# Patient Record
Sex: Female | Born: 1955 | Race: Black or African American | Hispanic: No | Marital: Single | Smoking: Current every day smoker
Health system: Southern US, Community
[De-identification: ages and names within clinical notes are randomized; demographics above are authoritative.]

## PROBLEM LIST (undated history)

## (undated) DIAGNOSIS — I10 Essential (primary) hypertension: Secondary | ICD-10-CM

## (undated) DIAGNOSIS — Z720 Tobacco use: Secondary | ICD-10-CM

## (undated) DIAGNOSIS — I639 Cerebral infarction, unspecified: Secondary | ICD-10-CM

## (undated) DIAGNOSIS — K219 Gastro-esophageal reflux disease without esophagitis: Secondary | ICD-10-CM

## (undated) DIAGNOSIS — S2232XA Fracture of one rib, left side, initial encounter for closed fracture: Secondary | ICD-10-CM

## (undated) DIAGNOSIS — E785 Hyperlipidemia, unspecified: Secondary | ICD-10-CM

## (undated) HISTORY — PX: LUNG SURGERY: SHX703

## (undated) HISTORY — DX: Fracture of one rib, left side, initial encounter for closed fracture: S22.32XA

## (undated) HISTORY — DX: Tobacco use: Z72.0

## (undated) HISTORY — DX: Cerebral infarction, unspecified: I63.9

## (undated) HISTORY — DX: Gastro-esophageal reflux disease without esophagitis: K21.9

## (undated) HISTORY — DX: Hyperlipidemia, unspecified: E78.5

---

## 2001-07-29 ENCOUNTER — Emergency Department (HOSPITAL_COMMUNITY): Admission: EM | Admit: 2001-07-29 | Discharge: 2001-07-29 | Payer: Self-pay | Admitting: Emergency Medicine

## 2001-07-29 ENCOUNTER — Encounter: Payer: Self-pay | Admitting: Emergency Medicine

## 2004-03-13 ENCOUNTER — Emergency Department (HOSPITAL_COMMUNITY): Admission: EM | Admit: 2004-03-13 | Discharge: 2004-03-13 | Payer: Self-pay | Admitting: Emergency Medicine

## 2004-03-13 ENCOUNTER — Emergency Department (HOSPITAL_COMMUNITY): Admission: EM | Admit: 2004-03-13 | Discharge: 2004-03-14 | Payer: Self-pay | Admitting: Emergency Medicine

## 2004-03-17 ENCOUNTER — Emergency Department (HOSPITAL_COMMUNITY): Admission: EM | Admit: 2004-03-17 | Discharge: 2004-03-17 | Payer: Self-pay | Admitting: Family Medicine

## 2004-09-11 ENCOUNTER — Emergency Department (HOSPITAL_COMMUNITY): Admission: EM | Admit: 2004-09-11 | Discharge: 2004-09-11 | Payer: Self-pay | Admitting: Family Medicine

## 2006-04-10 ENCOUNTER — Emergency Department (HOSPITAL_COMMUNITY): Admission: EM | Admit: 2006-04-10 | Discharge: 2006-04-10 | Payer: Self-pay | Admitting: Emergency Medicine

## 2006-04-11 ENCOUNTER — Emergency Department (HOSPITAL_COMMUNITY): Admission: EM | Admit: 2006-04-11 | Discharge: 2006-04-11 | Payer: Self-pay | Admitting: *Deleted

## 2006-04-16 ENCOUNTER — Emergency Department (HOSPITAL_COMMUNITY): Admission: EM | Admit: 2006-04-16 | Discharge: 2006-04-16 | Payer: Self-pay | Admitting: Family Medicine

## 2006-04-19 ENCOUNTER — Ambulatory Visit (HOSPITAL_COMMUNITY): Admission: RE | Admit: 2006-04-19 | Discharge: 2006-04-19 | Payer: Self-pay | Admitting: Emergency Medicine

## 2006-04-19 ENCOUNTER — Emergency Department (HOSPITAL_COMMUNITY): Admission: EM | Admit: 2006-04-19 | Discharge: 2006-04-20 | Payer: Self-pay | Admitting: Emergency Medicine

## 2006-04-21 ENCOUNTER — Observation Stay (HOSPITAL_COMMUNITY): Admission: EM | Admit: 2006-04-21 | Discharge: 2006-04-21 | Payer: Self-pay | Admitting: Emergency Medicine

## 2006-04-21 ENCOUNTER — Ambulatory Visit: Payer: Self-pay | Admitting: Cardiology

## 2006-05-26 ENCOUNTER — Ambulatory Visit: Payer: Self-pay | Admitting: Family Medicine

## 2006-09-21 ENCOUNTER — Ambulatory Visit: Payer: Self-pay | Admitting: Family Medicine

## 2006-11-19 ENCOUNTER — Emergency Department (HOSPITAL_COMMUNITY): Admission: EM | Admit: 2006-11-19 | Discharge: 2006-11-19 | Payer: Self-pay | Admitting: Emergency Medicine

## 2006-11-21 ENCOUNTER — Emergency Department (HOSPITAL_COMMUNITY): Admission: EM | Admit: 2006-11-21 | Discharge: 2006-11-21 | Payer: Self-pay | Admitting: Emergency Medicine

## 2006-11-25 ENCOUNTER — Ambulatory Visit: Payer: Self-pay | Admitting: Family Medicine

## 2007-04-12 ENCOUNTER — Ambulatory Visit: Payer: Self-pay | Admitting: Family Medicine

## 2007-04-23 ENCOUNTER — Emergency Department (HOSPITAL_COMMUNITY): Admission: EM | Admit: 2007-04-23 | Discharge: 2007-04-23 | Payer: Self-pay | Admitting: Emergency Medicine

## 2007-04-26 ENCOUNTER — Ambulatory Visit: Payer: Self-pay | Admitting: *Deleted

## 2007-08-24 ENCOUNTER — Encounter (INDEPENDENT_AMBULATORY_CARE_PROVIDER_SITE_OTHER): Payer: Self-pay | Admitting: *Deleted

## 2007-08-31 ENCOUNTER — Ambulatory Visit: Payer: Self-pay | Admitting: Internal Medicine

## 2008-01-28 ENCOUNTER — Inpatient Hospital Stay (HOSPITAL_COMMUNITY): Admission: EM | Admit: 2008-01-28 | Discharge: 2008-02-05 | Payer: Self-pay | Admitting: Emergency Medicine

## 2008-02-15 ENCOUNTER — Ambulatory Visit: Payer: Self-pay | Admitting: Family Medicine

## 2008-03-26 ENCOUNTER — Emergency Department (HOSPITAL_COMMUNITY): Admission: EM | Admit: 2008-03-26 | Discharge: 2008-03-26 | Payer: Self-pay | Admitting: Emergency Medicine

## 2008-03-27 ENCOUNTER — Emergency Department (HOSPITAL_COMMUNITY): Admission: EM | Admit: 2008-03-27 | Discharge: 2008-03-28 | Payer: Self-pay | Admitting: Emergency Medicine

## 2008-04-09 ENCOUNTER — Ambulatory Visit: Payer: Self-pay | Admitting: Internal Medicine

## 2008-05-28 ENCOUNTER — Emergency Department (HOSPITAL_COMMUNITY): Admission: EM | Admit: 2008-05-28 | Discharge: 2008-05-29 | Payer: Self-pay | Admitting: Emergency Medicine

## 2008-05-30 ENCOUNTER — Emergency Department (HOSPITAL_COMMUNITY): Admission: EM | Admit: 2008-05-30 | Discharge: 2008-05-30 | Payer: Self-pay | Admitting: Emergency Medicine

## 2008-06-07 ENCOUNTER — Ambulatory Visit: Payer: Self-pay | Admitting: Internal Medicine

## 2008-06-14 ENCOUNTER — Ambulatory Visit: Payer: Self-pay | Admitting: Internal Medicine

## 2008-06-15 ENCOUNTER — Ambulatory Visit: Payer: Self-pay | Admitting: Internal Medicine

## 2008-06-18 ENCOUNTER — Ambulatory Visit: Payer: Self-pay | Admitting: Internal Medicine

## 2009-06-30 ENCOUNTER — Emergency Department (HOSPITAL_COMMUNITY): Admission: EM | Admit: 2009-06-30 | Discharge: 2009-06-30 | Payer: Self-pay | Admitting: Emergency Medicine

## 2009-07-02 ENCOUNTER — Emergency Department (HOSPITAL_COMMUNITY): Admission: EM | Admit: 2009-07-02 | Discharge: 2009-07-03 | Payer: Self-pay | Admitting: Emergency Medicine

## 2009-07-03 ENCOUNTER — Ambulatory Visit: Payer: Self-pay | Admitting: Internal Medicine

## 2009-07-24 ENCOUNTER — Ambulatory Visit: Payer: Self-pay | Admitting: Internal Medicine

## 2009-07-29 ENCOUNTER — Ambulatory Visit (HOSPITAL_COMMUNITY): Admission: RE | Admit: 2009-07-29 | Discharge: 2009-07-29 | Payer: Self-pay | Admitting: Internal Medicine

## 2009-08-27 ENCOUNTER — Ambulatory Visit: Payer: Self-pay | Admitting: Internal Medicine

## 2009-08-27 LAB — CONVERTED CEMR LAB
Alkaline Phosphatase: 63 units/L (ref 39–117)
BUN: 15 mg/dL (ref 6–23)
Basophils Absolute: 0 10*3/uL (ref 0.0–0.1)
Basophils Relative: 0 % (ref 0–1)
CO2: 25 meq/L (ref 19–32)
Cholesterol: 249 mg/dL — ABNORMAL HIGH (ref 0–200)
Creatinine, Ser: 0.73 mg/dL (ref 0.40–1.20)
Eosinophils Absolute: 0 10*3/uL (ref 0.0–0.7)
Eosinophils Relative: 1 % (ref 0–5)
Glucose, Bld: 97 mg/dL (ref 70–99)
HCT: 41.8 % (ref 36.0–46.0)
HDL: 49 mg/dL (ref >39)
Hemoglobin: 14 g/dL (ref 12.0–15.0)
LDL Cholesterol: 174 mg/dL — ABNORMAL HIGH (ref 0–99)
MCHC: 33.5 g/dL (ref 30.0–36.0)
MCV: 93.5 fL (ref 78.0–100.0)
Monocytes Absolute: 0.2 10*3/uL (ref 0.1–1.0)
Monocytes Relative: 5 % (ref 3–12)
Neutro Abs: 1.9 10*3/uL (ref 1.7–7.7)
RBC: 4.47 M/uL (ref 3.87–5.11)
RDW: 14.4 % (ref 11.5–15.5)
Total Bilirubin: 0.5 mg/dL (ref 0.3–1.2)
Total Protein: 7.1 g/dL (ref 6.0–8.3)
Triglycerides: 132 mg/dL (ref <150)
VLDL: 26 mg/dL (ref 0–40)

## 2009-10-02 ENCOUNTER — Encounter: Payer: Self-pay | Admitting: Internal Medicine

## 2009-10-02 ENCOUNTER — Ambulatory Visit: Payer: Self-pay | Admitting: Internal Medicine

## 2009-10-02 LAB — CONVERTED CEMR LAB: GC Probe Amp, Genital: NEGATIVE

## 2010-04-07 ENCOUNTER — Emergency Department (HOSPITAL_COMMUNITY): Admission: EM | Admit: 2010-04-07 | Discharge: 2010-04-07 | Payer: Self-pay | Admitting: Emergency Medicine

## 2010-04-09 ENCOUNTER — Emergency Department (HOSPITAL_COMMUNITY): Admission: EM | Admit: 2010-04-09 | Discharge: 2010-04-09 | Payer: Self-pay | Admitting: Emergency Medicine

## 2010-04-16 ENCOUNTER — Ambulatory Visit: Payer: Self-pay | Admitting: Internal Medicine

## 2010-04-16 LAB — CONVERTED CEMR LAB
Albumin: 4.3 g/dL (ref 3.5–5.2)
BUN: 33 mg/dL — ABNORMAL HIGH (ref 6–23)
Calcium: 9.7 mg/dL (ref 8.4–10.5)
Chloride: 98 meq/L (ref 96–112)
Creatinine, Ser: 1.11 mg/dL (ref 0.40–1.20)
Eosinophils Absolute: 0.1 10*3/uL (ref 0.0–0.7)
Eosinophils Relative: 1 % (ref 0–5)
Glucose, Bld: 91 mg/dL (ref 70–99)
HCT: 41.5 % (ref 36.0–46.0)
Hemoglobin: 13.9 g/dL (ref 12.0–15.0)
Lipase: 56 units/L (ref 0–75)
Lymphocytes Relative: 46 % (ref 12–46)
Lymphs Abs: 2.8 10*3/uL (ref 0.7–4.0)
MCV: 95.6 fL (ref 78.0–100.0)
Monocytes Absolute: 0.4 10*3/uL (ref 0.1–1.0)
Platelets: 264 10*3/uL (ref 150–400)
Potassium: 3 meq/L — ABNORMAL LOW (ref 3.5–5.3)
RDW: 14.1 % (ref 11.5–15.5)

## 2010-12-28 ENCOUNTER — Encounter: Payer: Self-pay | Admitting: *Deleted

## 2011-02-24 LAB — URINE MICROSCOPIC-ADD ON

## 2011-02-24 LAB — WET PREP, GENITAL: Yeast Wet Prep HPF POC: NONE SEEN

## 2011-02-24 LAB — URINE CULTURE: Colony Count: NO GROWTH

## 2011-02-24 LAB — URINALYSIS, ROUTINE W REFLEX MICROSCOPIC
Glucose, UA: NEGATIVE mg/dL
Leukocytes, UA: NEGATIVE
Nitrite: NEGATIVE
Protein, ur: >300 mg/dL — AB
Specific Gravity, Urine: 1.031 — ABNORMAL HIGH (ref 1.005–1.030)
Specific Gravity, Urine: 1.046 — ABNORMAL HIGH (ref 1.005–1.030)
Urobilinogen, UA: 0.2 mg/dL (ref 0.0–1.0)
Urobilinogen, UA: 2 mg/dL — ABNORMAL HIGH (ref 0.0–1.0)
pH: 5 (ref 5.0–8.0)

## 2011-02-24 LAB — COMPREHENSIVE METABOLIC PANEL
CO2: 25 mEq/L (ref 19–32)
Calcium: 9.4 mg/dL (ref 8.4–10.5)
Creatinine, Ser: 0.73 mg/dL (ref 0.4–1.2)
GFR calc Af Amer: >60 mL/min (ref >60)
GFR calc non Af Amer: >60 mL/min (ref >60)
Glucose, Bld: 99 mg/dL (ref 70–99)

## 2011-02-24 LAB — DIFFERENTIAL
Basophils Absolute: 0 10*3/uL (ref 0.0–0.1)
Basophils Relative: 0 % (ref 0–1)
Eosinophils Absolute: 0 10*3/uL (ref 0.0–0.7)
Eosinophils Relative: 0 % (ref 0–5)
Monocytes Absolute: 0.6 10*3/uL (ref 0.1–1.0)
Monocytes Relative: 7 % (ref 3–12)

## 2011-02-24 LAB — CBC
Hemoglobin: 17.9 g/dL — ABNORMAL HIGH (ref 12.0–15.0)
MCHC: 35.1 g/dL (ref 30.0–36.0)
MCV: 96.3 fL (ref 78.0–100.0)
RBC: 5.3 MIL/uL — ABNORMAL HIGH (ref 3.87–5.11)

## 2011-02-24 LAB — GC/CHLAMYDIA PROBE AMP, GENITAL
Chlamydia, DNA Probe: NEGATIVE
GC Probe Amp, Genital: NEGATIVE

## 2011-03-15 LAB — URINE CULTURE: Colony Count: 10000

## 2011-03-15 LAB — RAPID URINE DRUG SCREEN, HOSP PERFORMED: Barbiturates: NOT DETECTED

## 2011-03-15 LAB — URINE MICROSCOPIC-ADD ON

## 2011-03-15 LAB — CBC
MCHC: 34 g/dL (ref 30.0–36.0)
MCHC: 34.2 g/dL (ref 30.0–36.0)
MCV: 95.7 fL (ref 78.0–100.0)
MCV: 95.9 fL (ref 78.0–100.0)
RBC: 5.32 MIL/uL — ABNORMAL HIGH (ref 3.87–5.11)
RDW: 13.6 % (ref 11.5–15.5)
RDW: 14.1 % (ref 11.5–15.5)

## 2011-03-15 LAB — DIFFERENTIAL
Eosinophils Absolute: 0 10*3/uL (ref 0.0–0.7)
Eosinophils Relative: 0 % (ref 0–5)
Lymphocytes Relative: 19 % (ref 12–46)
Lymphocytes Relative: 28 % (ref 12–46)
Lymphs Abs: 1.6 10*3/uL (ref 0.7–4.0)
Lymphs Abs: 2.1 10*3/uL (ref 0.7–4.0)
Monocytes Relative: 6 % (ref 3–12)
Monocytes Relative: 8 % (ref 3–12)
Neutrophils Relative %: 64 % (ref 43–77)
Neutrophils Relative %: 76 % (ref 43–77)

## 2011-03-15 LAB — URINALYSIS, ROUTINE W REFLEX MICROSCOPIC
Glucose, UA: NEGATIVE mg/dL
Ketones, ur: 15 mg/dL — AB
Nitrite: NEGATIVE
Nitrite: NEGATIVE
Specific Gravity, Urine: 1.046 — ABNORMAL HIGH (ref 1.005–1.030)
pH: 6 (ref 5.0–8.0)
pH: 6 (ref 5.0–8.0)

## 2011-03-15 LAB — COMPREHENSIVE METABOLIC PANEL
ALT: 11 U/L (ref 0–35)
ALT: 9 U/L (ref 0–35)
AST: 12 U/L (ref 0–37)
AST: 19 U/L (ref 0–37)
CO2: 25 mEq/L (ref 19–32)
Calcium: 10.1 mg/dL (ref 8.4–10.5)
Calcium: 9.3 mg/dL (ref 8.4–10.5)
Creatinine, Ser: 0.72 mg/dL (ref 0.4–1.2)
Creatinine, Ser: 0.81 mg/dL (ref 0.4–1.2)
GFR calc Af Amer: >60 mL/min (ref >60)
GFR calc Af Amer: >60 mL/min (ref >60)
GFR calc non Af Amer: >60 mL/min (ref >60)
GFR calc non Af Amer: >60 mL/min (ref >60)
Glucose, Bld: 90 mg/dL (ref 70–99)
Sodium: 133 mEq/L — ABNORMAL LOW (ref 135–145)
Sodium: 138 mEq/L (ref 135–145)
Total Protein: 7.2 g/dL (ref 6.0–8.3)
Total Protein: 8.2 g/dL (ref 6.0–8.3)

## 2011-03-15 LAB — PREGNANCY, URINE: Preg Test, Ur: NEGATIVE

## 2011-03-15 LAB — LIPASE, BLOOD: Lipase: 22 U/L (ref 11–59)
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
## 2011-04-24 NOTE — H&P (Signed)
NAMEPORTLAND, SARINANA             ACCOUNT NO.:  [id number]   MEDICAL RECORD NO.:  [id number]          PATIENT TYPE:  EMS   LOCATION:  MAJO                         FACILITY:  [facility]   PHYSICIAN:  Hollice Espy, M.D.DATE OF BIRTH:  [birth year]   DATE OF PROCEDURE:  DATE OF DISCHARGE:                      STAT - MUST CHANGE TO CORRECT WORK TYPE   ATTENDING PHYSICIAN:  Corinna L. Lendell Caprice, M.D.   PRIMARY CARE PHYSICIAN:  The patient has no primary care physician.   CHIEF COMPLAINT:  Stroke.   HISTORY OF PRESENT ILLNESS:  The patient is a 55 year old African American  female with past medical history only for a recent UTI who had several days  prior to admission had episodes of blurry vision and has loss of vision. She  became concerned and came to the emergency room for further evaluation.  A  CT scan of her head which was done was negative.  The patient was set up and  had a outpatient MRI done which showed acute small infarct in the left  periventricular area.  Given these findings, she was called and told to come  back to the emergency room for further evaluation.  This was on Apr 19, 2006.  The patient was evaluated by neurology who felt the patient should be  admitted by the medical service.  The patient, however, said that she had to  tend for a sick daughter and she left the emergency room against medical  advice.  She returned back today saying that her daughter was feeling  better.  Currently the patient is doing well.  Her initial blurry and loss  of vision symptoms had resolved initially prior to her MRI.  Currently she  has been well. She denies any headaches, vision changes, dysphagia, chest  pain, palpitations, shortness of breath, wheezing, cough, abdominal pain,  hematuria, dysuria, constipation, diarrhea, focal extremity numbness,  weakness, or pain. Review of systems otherwise negative.   PAST MEDICAL HISTORY:  Recent UTI and ovarian cysts. She tells me that  after  being evaluated now, her blood pressure has been high but she has never been  told that she has high blood pressure   MEDICATIONS:  1.  Flagyl 500 mg p.o. t.i.d. for two days.  2.  Cipro 500 mg p.o. b.i.d. for one more day.  3.  Multivitamins p.o. daily.  4.  P.r.n. Aleve.   SOCIAL HISTORY:  The patient smokes half pack a day of tobacco.  She denies  any alcohol or drug use.   FAMILY HISTORY:  Noncontributory.   ALLERGIES:  No known drug allergies.   PHYSICAL EXAMINATION:  VITAL SIGNS:  Temperature 98, heart rate 90, blood  pressure initially 144/101, since then has dropped to 121/96, respirations  25, oxygen saturation 97% on room air.  GENERAL:  The patient is alert and oriented x3, in no apparent distress.  HEENT:  Normocephalic, atraumatic.  Cranial nerves II-XII intact. No carotid  bruits.  HEART:  Regular rate and rhythm. Occasional ectopic beat.  LUNGS:  Clear to auscultation bilaterally.  ABDOMEN:  Soft, nontender.  Positive bowel sounds.  EXTREMITIES:  No clubbing  or cyanosis or edema.  MUSCULOSKELETAL:  Completely intact and equal on all sides.  Flexion and  extension and grip on upper and lower extremities.  No pronator drift.  No  cerebellar dysfunction.   LABORATORY DATA:  Based on the patient's previous labs, BMET, CBC normal.  Repeat within normal limits.   ASSESSMENT/PLAN:  1.  Acute CVA, likely secondary to undiagnosed and uncontrolled      hypertension.  Plan for admitting the patient, put her on aspirin 325 mg      p.o. daily.  Start her on low dose lisinopril.  I do not want to      hypoperfuse her too quickly as well as check __________ bilateral      carotid Dopplers as well as notifying neurology of her readmission.  2.  Tobacco abuse.  Provide tobacco counseling.  3.  Recent UTI and ovarian cyst.  Continue Cipro and Flagyl.      Hollice Espy, M.D.  Electronically Signed     SKK/MEDQ  D:  04/21/2006  T:  04/21/2006  Job:  [id number]

## 2011-04-24 NOTE — Discharge Summary (Signed)
NAME:  Brittany Burns, Brittany Burns             ACCOUNT NO.:  [id number]   MEDICAL RECORD NO.:  [id number]           PATIENT TYPE:   LOCATION:                                 FACILITY:   PHYSICIAN:  Corinna L. Lendell Caprice, MD     DATE OF BIRTH:   DATE OF ADMISSION:  04/21/2006  DATE OF DISCHARGE:                                 DISCHARGE SUMMARY   DISCHARGE DIAGNOSES:  1.  Subacute stroke, without any residual deficits.  2.  Hypertension.   DISCHARGE MEDICATIONS:  1.  Aspirin 325 mg a day.  2.  Hydrochlorothiazide 25 mg a day.   CONDITION:  Stable.   ACTIVITY:  Ad lib.   FOLLOWUP:  With [facility] first available appointment.   DIET:  Should be low-salt.   HISTORY AND HOSPITAL COURSE:  [name] is a 55 year old black female  patient who was admitted with a subacute stroke.  Apparently she had  presented to the emergency room several days prior to this admission with  complaints of blurry vision as well as apparently acute loss of vision.  These episodes resolved.  She had a CT scan of the brain which was negative;  and it was recommended that she be admitted to the hospital, but she left  against medical advice.  Somebody schedule an outpatient MRI scan which  showed an infarct of the left periventricular area; I believe this was ED  staff, there were mild atherosclerotic-type changes, but the MRA was  limited.  She had carotid Dopplers which showed no critical stenosis.  She  has an echocardiogram--results of which are pending.  The patient is very  anxious to go.  She was started on aspirin and will be discharged on  hydrochlorothiazide __________ due to fear of needles.  Please see H&P for  complete __________ were significant for a urine drug screen positive for  THC and benzodiazepines.  The patient voices understanding.  She also has a  history of tobacco abuse and was counseled against.      Corinna L. Lendell Caprice, MD  Electronically Signed     CLS/MEDQ  D:   04/21/2006  T:  04/22/2006  Job:  [id number]

## 2011-08-28 LAB — BASIC METABOLIC PANEL
CO2: 26
Chloride: 102
Creatinine, Ser: 0.7
GFR calc Af Amer: >60
Glucose, Bld: 100 — ABNORMAL HIGH
Sodium: 136

## 2011-08-28 LAB — DIFFERENTIAL
Basophils Absolute: 0
Eosinophils Relative: 0
Lymphocytes Relative: 9 — ABNORMAL LOW
Lymphs Abs: 1.3
Neutro Abs: 13.6 — ABNORMAL HIGH
Neutrophils Relative %: 90 — ABNORMAL HIGH

## 2011-08-28 LAB — URINE MICROSCOPIC-ADD ON

## 2011-08-28 LAB — SAMPLE TO BLOOD BANK

## 2011-08-28 LAB — CBC
Hemoglobin: 13
MCHC: 34.3
MCV: 95
Platelets: 271
RBC: 4
RDW: 14.4
RDW: 14.4
WBC: 15.1 — ABNORMAL HIGH

## 2011-08-28 LAB — POCT I-STAT CREATININE: Creatinine, Ser: 0.8

## 2011-08-28 LAB — URINALYSIS, ROUTINE W REFLEX MICROSCOPIC
Glucose, UA: NEGATIVE
Leukocytes, UA: NEGATIVE
Protein, ur: 30 — AB
pH: 5.5

## 2011-08-28 LAB — I-STAT 8, (EC8 V) (CONVERTED LAB)
Acid-base deficit: 4 — ABNORMAL HIGH
Bicarbonate: 21.7
Chloride: 106
Operator id: 294521
pCO2, Ven: 40.1 — ABNORMAL LOW
pH, Ven: 7.342 — ABNORMAL HIGH

## 2011-08-28 LAB — POCT PREGNANCY, URINE
Operator id: 29452
Preg Test, Ur: NEGATIVE

## 2011-08-29 ENCOUNTER — Emergency Department (HOSPITAL_COMMUNITY)
Admission: EM | Admit: 2011-08-29 | Discharge: 2011-08-29 | Disposition: A | Discharge status: Home / Self Care | Payer: Self-pay | Attending: Emergency Medicine | Admitting: Emergency Medicine

## 2011-08-29 DIAGNOSIS — F17200 Nicotine dependence, unspecified, uncomplicated: Secondary | ICD-10-CM | POA: Insufficient documentation

## 2011-08-29 DIAGNOSIS — Z79899 Other long term (current) drug therapy: Secondary | ICD-10-CM | POA: Insufficient documentation

## 2011-08-29 DIAGNOSIS — R040 Epistaxis: Secondary | ICD-10-CM | POA: Insufficient documentation

## 2011-08-29 DIAGNOSIS — Z9119 Patient's noncompliance with other medical treatment and regimen: Secondary | ICD-10-CM | POA: Insufficient documentation

## 2011-08-29 DIAGNOSIS — I10 Essential (primary) hypertension: Secondary | ICD-10-CM | POA: Insufficient documentation

## 2011-08-29 DIAGNOSIS — Z91199 Patient's noncompliance with other medical treatment and regimen due to unspecified reason: Secondary | ICD-10-CM | POA: Insufficient documentation

## 2011-08-29 DIAGNOSIS — F1210 Cannabis abuse, uncomplicated: Secondary | ICD-10-CM | POA: Insufficient documentation

## 2011-08-29 LAB — CBC
MCH: 31.9 pg (ref 26.0–34.0)
MCV: 92.2 fL (ref 78.0–100.0)
Platelets: 230 10*3/uL (ref 150–400)
RDW: 14 % (ref 11.5–15.5)

## 2011-09-01 LAB — CBC
HCT: 44.6
Hemoglobin: 15.7 — ABNORMAL HIGH
Hemoglobin: 16 — ABNORMAL HIGH
MCHC: 34.2
Platelets: 305
Platelets: 288
RBC: 4.68
RDW: 13.6
WBC: 9.1

## 2011-09-01 LAB — POCT PREGNANCY, URINE: Operator id: 29728

## 2011-09-01 LAB — COMPREHENSIVE METABOLIC PANEL
ALT: 12
AST: 20
Albumin: 4.6
Alkaline Phosphatase: 78
BUN: 14
Calcium: 9.2
Creatinine, Ser: 0.72
GFR calc Af Amer: >60
GFR calc Af Amer: >60
Glucose, Bld: 100 — ABNORMAL HIGH
Potassium: 3.3 — ABNORMAL LOW
Sodium: 131 — ABNORMAL LOW
Total Protein: 8.4 — ABNORMAL HIGH
Total Protein: 7.4

## 2011-09-01 LAB — URINE MICROSCOPIC-ADD ON

## 2011-09-01 LAB — URINALYSIS, ROUTINE W REFLEX MICROSCOPIC
Glucose, UA: NEGATIVE
Ketones, ur: >80 — AB
Nitrite: NEGATIVE
Protein, ur: >300 — AB
Specific Gravity, Urine: 1.041 — ABNORMAL HIGH
Urobilinogen, UA: 1

## 2011-09-01 LAB — DIFFERENTIAL
Eosinophils Absolute: 0
Eosinophils Relative: 0
Lymphocytes Relative: 15
Lymphs Abs: 2.1
Monocytes Absolute: 0.5
Monocytes Relative: 6
Monocytes Relative: 9
Neutro Abs: 7.2
Neutrophils Relative %: 63

## 2011-09-03 LAB — POCT I-STAT, CHEM 8
BUN: 16
Calcium, Ion: 1.13
Chloride: 102
Creatinine, Ser: 0.8
Glucose, Bld: 123 — ABNORMAL HIGH
TCO2: 25

## 2011-09-03 LAB — URINALYSIS, ROUTINE W REFLEX MICROSCOPIC
Bilirubin Urine: NEGATIVE
Glucose, UA: NEGATIVE
Leukocytes, UA: NEGATIVE
Nitrite: NEGATIVE
Protein, ur: >300 — AB
Specific Gravity, Urine: 1.029
pH: 5.5
pH: 6

## 2011-09-03 LAB — URINE CULTURE

## 2011-09-03 LAB — DIFFERENTIAL
Basophils Absolute: 0.1
Basophils Relative: 0
Basophils Relative: 1
Eosinophils Absolute: 0
Eosinophils Absolute: 0
Eosinophils Relative: 0
Lymphs Abs: 1.2
Monocytes Absolute: 0.6
Monocytes Relative: 6
Neutro Abs: 9.9 — ABNORMAL HIGH
Neutrophils Relative %: 69

## 2011-09-03 LAB — URINE MICROSCOPIC-ADD ON

## 2011-09-03 LAB — CBC
HCT: 48.9 — ABNORMAL HIGH
MCHC: 33.9
MCV: 95.6
Platelets: 294
Platelets: 318
RDW: 13.8
RDW: 14
WBC: 9.5

## 2011-09-03 LAB — COMPREHENSIVE METABOLIC PANEL
ALT: 15
Albumin: 4
Alkaline Phosphatase: 73
GFR calc Af Amer: >60
Potassium: 3.3 — ABNORMAL LOW
Sodium: 138
Total Protein: 7.1

## 2011-09-03 LAB — LIPASE, BLOOD: Lipase: 24

## 2011-09-03 LAB — PREGNANCY, URINE: Preg Test, Ur: NEGATIVE

## 2012-01-17 ENCOUNTER — Emergency Department (HOSPITAL_COMMUNITY)
Admission: EM | Admit: 2012-01-17 | Discharge: 2012-01-17 | Disposition: A | Discharge status: Home / Self Care | Payer: Self-pay | Attending: Emergency Medicine | Admitting: Emergency Medicine

## 2012-01-17 ENCOUNTER — Encounter (HOSPITAL_COMMUNITY): Payer: Self-pay | Admitting: *Deleted

## 2012-01-17 DIAGNOSIS — Z79899 Other long term (current) drug therapy: Secondary | ICD-10-CM | POA: Insufficient documentation

## 2012-01-17 DIAGNOSIS — F17200 Nicotine dependence, unspecified, uncomplicated: Secondary | ICD-10-CM | POA: Insufficient documentation

## 2012-01-17 DIAGNOSIS — R141 Gas pain: Secondary | ICD-10-CM | POA: Insufficient documentation

## 2012-01-17 DIAGNOSIS — R112 Nausea with vomiting, unspecified: Secondary | ICD-10-CM | POA: Insufficient documentation

## 2012-01-17 DIAGNOSIS — K219 Gastro-esophageal reflux disease without esophagitis: Secondary | ICD-10-CM | POA: Insufficient documentation

## 2012-01-17 DIAGNOSIS — R142 Eructation: Secondary | ICD-10-CM | POA: Insufficient documentation

## 2012-01-17 DIAGNOSIS — R197 Diarrhea, unspecified: Secondary | ICD-10-CM | POA: Insufficient documentation

## 2012-01-17 DIAGNOSIS — I10 Essential (primary) hypertension: Secondary | ICD-10-CM | POA: Insufficient documentation

## 2012-01-17 HISTORY — DX: Essential (primary) hypertension: I10

## 2012-01-17 LAB — URINE MICROSCOPIC-ADD ON

## 2012-01-17 LAB — URINALYSIS, ROUTINE W REFLEX MICROSCOPIC
Ketones, ur: 15 mg/dL — AB
Leukocytes, UA: NEGATIVE
Nitrite: NEGATIVE
Protein, ur: >300 mg/dL — AB

## 2012-01-17 LAB — BASIC METABOLIC PANEL
CO2: 24 mEq/L (ref 19–32)
Chloride: 102 mEq/L (ref 96–112)
Glucose, Bld: 107 mg/dL — ABNORMAL HIGH (ref 70–99)
Potassium: 3.2 mEq/L — ABNORMAL LOW (ref 3.5–5.1)
Sodium: 138 mEq/L (ref 135–145)

## 2012-01-17 LAB — CBC
Hemoglobin: 15.7 g/dL — ABNORMAL HIGH (ref 12.0–15.0)
MCH: 31.5 pg (ref 26.0–34.0)
Platelets: 310 10*3/uL (ref 150–400)
RBC: 4.98 MIL/uL (ref 3.87–5.11)
WBC: 11.6 10*3/uL — ABNORMAL HIGH (ref 4.0–10.5)

## 2012-01-17 MED ORDER — GI COCKTAIL ~~LOC~~
30.0000 mL | Freq: Once | ORAL | Status: AC
  Administered 2012-01-17: 30 mL via ORAL
  Filled 2012-01-17: qty 30

## 2012-01-17 MED ORDER — SODIUM CHLORIDE 0.9 % IV BOLUS (SEPSIS)
1000.0000 mL | Freq: Once | INTRAVENOUS | Status: AC
  Administered 2012-01-17: 1000 mL via INTRAVENOUS

## 2012-01-17 MED ORDER — ONDANSETRON HCL 4 MG/2ML IJ SOLN
4.0000 mg | Freq: Once | INTRAMUSCULAR | Status: AC
  Administered 2012-01-17: 4 mg via INTRAVENOUS
  Filled 2012-01-17: qty 2

## 2012-01-17 MED ORDER — ACETAMINOPHEN 325 MG PO TABS
650.0000 mg | ORAL_TABLET | Freq: Once | ORAL | Status: AC
  Administered 2012-01-17: 650 mg via ORAL
  Filled 2012-01-17: qty 2

## 2012-01-17 MED ORDER — PANTOPRAZOLE SODIUM 40 MG IV SOLR
40.0000 mg | Freq: Once | INTRAVENOUS | Status: AC
  Administered 2012-01-17: 40 mg via INTRAVENOUS
  Filled 2012-01-17: qty 40

## 2012-01-17 MED ORDER — ONDANSETRON HCL 4 MG PO TABS: 4.0000 mg | ORAL_TABLET | Freq: Four times a day (QID) | ORAL | Status: AC

## 2012-01-17 MED ORDER — PROMETHAZINE HCL 25 MG/ML IJ SOLN
25.0000 mg | Freq: Once | INTRAMUSCULAR | Status: AC
  Administered 2012-01-17: 25 mg via INTRAVENOUS
  Filled 2012-01-17: qty 1

## 2012-01-17 MED ORDER — POTASSIUM CHLORIDE CRYS ER 20 MEQ PO TBCR
20.0000 meq | EXTENDED_RELEASE_TABLET | Freq: Once | ORAL | Status: AC
  Administered 2012-01-17: 20 meq via ORAL
  Filled 2012-01-17: qty 1

## 2012-01-17 NOTE — ED Notes (Signed)
Reports n/v since yesterday, had diarrhea yesterday but it has stopped. Reports having urinary frequency and only able to urinate small amounts at a time. Vomiting at triage.

## 2012-01-17 NOTE — ED Notes (Signed)
Pt not very receptive to the fluid challenge.  Pt states the nausea meds are not working.  Pt drank a small amount and is burping, but nothing is coming up.

## 2012-01-17 NOTE — ED Provider Notes (Addendum)
History     CSN: [id number]  Arrival date & time 01/17/12  1248   First MD Initiated Contact with Patient 01/17/12 1318      Chief Complaint  Patient presents with  . Emesis  . Diarrhea    (Consider location/radiation/quality/duration/timing/severity/associated sxs/prior treatment) HPI   Pt presents to the ED with complaints of diarrhea for one day, which resolved a couple of days ago and vomiting with burping. The patient. States that she has been unable to keep down fluids. She denies abdominal pain, chest pain, light headedness, syncope or any other associated symptoms. She has a history of GERD which she takes Omeprazole every day for. Past Medical History  Diagnosis Date  . Hypertension   . Acid reflux     History reviewed. No pertinent past surgical history.  History reviewed. No pertinent family history.  History  Substance Use Topics  . Smoking status: Current Everyday Smoker    Types: Cigarettes  . Smokeless tobacco: Not on file  . Alcohol Use: No    OB History    Grav Para Term Preterm Abortions TAB SAB Ect Mult Living                  Review of Systems  All other systems reviewed and are negative.    Allergies  Review of patient's allergies indicates no known allergies.  Home Medications   Current Outpatient Rx  Name Route Sig Dispense Refill  . OMEPRAZOLE PO Oral Take 1 capsule by mouth daily.    Marland Kitchen PRESCRIPTION MEDICATION Oral Take 1 tablet by mouth daily. Blood pressure medication    . PRESCRIPTION MEDICATION Oral Take 1 tablet by mouth daily. Hot flash medication      BP 140/60  Pulse 72  Temp(Src) 98 F (36.7 C) (Oral)  Resp 18  SpO2 98%  Physical Exam  Nursing note and vitals reviewed. Constitutional: She appears well-developed and well-nourished. No distress.  HENT:  Head: Normocephalic and atraumatic.  Eyes: Pupils are equal, round, and reactive to light.  Neck: Normal range of motion. Neck supple.  Cardiovascular: Normal  rate and regular rhythm.   Pulmonary/Chest: Effort normal.  Abdominal: Soft.  Neurological: She is alert.  Skin: Skin is warm and dry.    ED Course  Procedures (including critical care time)  Labs Reviewed  URINALYSIS, ROUTINE W REFLEX MICROSCOPIC - Abnormal; Notable for the following:    APPearance HAZY (*)    Specific Gravity, Urine 1.036 (*)    Hgb urine dipstick MODERATE (*)    Bilirubin Urine SMALL (*)    Ketones, ur 15 (*)    Protein, ur >300 (*)    All other components within normal limits  CBC - Abnormal; Notable for the following:    WBC 11.6 (*)    Hemoglobin 15.7 (*)    All other components within normal limits  BASIC METABOLIC PANEL - Abnormal; Notable for the following:    Potassium 3.2 (*)    Glucose, Bld 107 (*)    All other components within normal limits  URINE MICROSCOPIC-ADD ON - Abnormal; Notable for the following:    Squamous Epithelial / LPF MANY (*)    Bacteria, UA FEW (*)    All other components within normal limits   No results found.   1. Nausea and vomiting       MDM  .   Patient Complaints #1 burping #2 nausea #3 vomiting  Time of re-evaluation  Plan  3:30 PM _ Patient  re-evaluated and is still burping and very nauseous after a total of 8 mg Zofran, a GI cocktail and 1L NS. Pt has not vomited since in Exam Room.  I have just ordered IV protonix, phenergan and another bag of fluids. After this, if the patient can tolerate PO challenge, can be D/C with antiemetics.   BMP shows a slightly low potassium (3.2) CBC shows WBC (11.6) Urinalysis urinalysis shows few bacteria but many epithelial cells with neg nitrites and leukocytes.  End of Shift. Patient  care handed over to [facility], PA-C and Dr. Clarene Duke.  Dorthula Matas, PA 01/17/12 1613  Dorthula Matas, PA 03/10/12 [phone]

## 2012-01-17 NOTE — ED Provider Notes (Signed)
Patient states she's feeling much improved and is requesting some crackers to eat. She is able to hold down some fluid did take some by mouth Tylenol for mild headache. She remains afebrile. Vital signs are stable. Patient is requesting to go home. We will send her out with prescription antiemetics. Spoke at length with patient about worrisome signs and symptoms it should warrent return to emergency department immediately. Patient is agreeable to plan. She voices her understanding.  Jenness Corner, [place] 01/17/12 1732

## 2012-01-18 NOTE — ED Provider Notes (Signed)
Medical screening examination/treatment/procedure(s) were performed by non-physician practitioner and as supervising physician I was immediately available for consultation/collaboration.  Juliet Rude. Rubin Payor, MD 01/18/12 [phone]

## 2012-01-18 NOTE — ED Provider Notes (Signed)
Medical screening examination/treatment/procedure(s) were performed by non-physician practitioner and as supervising physician I was immediately available for consultation/collaboration.   Laray Anger, DO 01/18/12 [phone]

## 2012-03-11 NOTE — ED Provider Notes (Signed)
Medical screening examination/treatment/procedure(s) were performed by non-physician practitioner and as supervising physician I was immediately available for consultation/collaboration.  Juliet Rude. Rubin Payor, MD 03/11/12 0005

## 2012-08-30 ENCOUNTER — Emergency Department (INDEPENDENT_AMBULATORY_CARE_PROVIDER_SITE_OTHER)
Admission: EM | Admit: 2012-08-30 | Discharge: 2012-08-30 | Disposition: A | Discharge status: Home / Self Care | Payer: Self-pay | Source: Home / Self Care

## 2012-08-30 ENCOUNTER — Encounter (HOSPITAL_COMMUNITY): Payer: Self-pay | Admitting: *Deleted

## 2012-08-30 DIAGNOSIS — I10 Essential (primary) hypertension: Secondary | ICD-10-CM

## 2012-08-30 DIAGNOSIS — Z760 Encounter for issue of repeat prescription: Secondary | ICD-10-CM

## 2012-08-30 MED ORDER — PANTOPRAZOLE SODIUM 40 MG PO TBEC: 40.0000 mg | DELAYED_RELEASE_TABLET | Freq: Every day | ORAL | Status: DC

## 2012-08-30 MED ORDER — CLONIDINE HCL 0.2 MG PO TABS: 0.2000 mg | ORAL_TABLET | Freq: Two times a day (BID) | ORAL | Status: DC

## 2012-08-30 MED ORDER — AMLODIPINE BESYLATE 10 MG PO TABS: 10.0000 mg | ORAL_TABLET | Freq: Every day | ORAL | Status: DC

## 2012-08-30 NOTE — ED Notes (Signed)
Pt reports needing htn meds and gerd med refilled. 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 - currently trying to get on disability
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
## 2013-01-20 ENCOUNTER — Ambulatory Visit: Payer: Self-pay

## 2013-01-20 ENCOUNTER — Ambulatory Visit: Payer: Self-pay | Admitting: Internal Medicine

## 2013-01-23 ENCOUNTER — Ambulatory Visit: Payer: Self-pay

## 2013-01-23 ENCOUNTER — Encounter: Payer: Self-pay | Admitting: Internal Medicine

## 2013-01-23 ENCOUNTER — Ambulatory Visit (INDEPENDENT_AMBULATORY_CARE_PROVIDER_SITE_OTHER): Payer: Self-pay | Admitting: Internal Medicine

## 2013-01-23 VITALS — BP 146/88 | HR 73 | Temp 97.8°F | Ht 63.0 in | Wt 123.4 lb

## 2013-01-23 DIAGNOSIS — Z0000 Encounter for general adult medical examination without abnormal findings: Secondary | ICD-10-CM

## 2013-01-23 DIAGNOSIS — S2232XA Fracture of one rib, left side, initial encounter for closed fracture: Secondary | ICD-10-CM | POA: Insufficient documentation

## 2013-01-23 DIAGNOSIS — R079 Chest pain, unspecified: Secondary | ICD-10-CM

## 2013-01-23 DIAGNOSIS — I10 Essential (primary) hypertension: Secondary | ICD-10-CM

## 2013-01-23 DIAGNOSIS — K219 Gastro-esophageal reflux disease without esophagitis: Secondary | ICD-10-CM

## 2013-01-23 DIAGNOSIS — R0781 Pleurodynia: Secondary | ICD-10-CM

## 2013-01-23 LAB — CBC WITH DIFFERENTIAL/PLATELET
Basophils Relative: 0 % (ref 0–1)
Eosinophils Absolute: 0 10*3/uL (ref 0.0–0.7)
Eosinophils Relative: 1 % (ref 0–5)
HCT: 41.7 % (ref 36.0–46.0)
Hemoglobin: 14.3 g/dL (ref 12.0–15.0)
Lymphocytes Relative: 54 % — ABNORMAL HIGH (ref 12–46)
Lymphs Abs: 3.1 10*3/uL (ref 0.7–4.0)
MCH: 31.7 pg (ref 26.0–34.0)
MCHC: 34.3 g/dL (ref 30.0–36.0)
Monocytes Absolute: 0.3 10*3/uL (ref 0.1–1.0)
Monocytes Relative: 5 % (ref 3–12)
Neutro Abs: 2.3 10*3/uL (ref 1.7–7.7)
Neutrophils Relative %: 40 % — ABNORMAL LOW (ref 43–77)
RBC: 4.51 MIL/uL (ref 3.87–5.11)

## 2013-01-23 LAB — LIPID PANEL
Cholesterol: 246 mg/dL — ABNORMAL HIGH (ref 0–200)
LDL Cholesterol: 160 mg/dL — ABNORMAL HIGH (ref 0–99)
Total CHOL/HDL Ratio: 5.9 Ratio
Triglycerides: 219 mg/dL — ABNORMAL HIGH (ref <150)
VLDL: 44 mg/dL — ABNORMAL HIGH (ref 0–40)

## 2013-01-23 MED ORDER — CLONIDINE HCL 0.2 MG PO TABS: 0.2000 mg | ORAL_TABLET | Freq: Every day | ORAL | Status: DC

## 2013-01-23 MED ORDER — LISINOPRIL 10 MG PO TABS: 10.0000 mg | ORAL_TABLET | Freq: Every day | ORAL | Status: DC

## 2013-01-23 MED ORDER — PANTOPRAZOLE SODIUM 40 MG PO TBEC: 40.0000 mg | DELAYED_RELEASE_TABLET | Freq: Every day | ORAL | Status: DC
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
## 2013-01-23 NOTE — Assessment & Plan Note (Addendum)
Well controlled, will continue protonix 40mg  qd -Will need to check for micronutrients malabsorption in the future once she has insurance

## 2013-01-23 NOTE — Patient Instructions (Addendum)
Stop taking Norvasc Start taking Lisinopril 10mg  one tablet daily Continue Clonidine 0.2mg  one tablet at bedtime Get labs today and we will call you with any abnormal lab results Get Chest Xray, please get this done before you come back to see me Follow up with Dr. Anselm Jungling in 3-4 weeks

## 2013-01-23 NOTE — Assessment & Plan Note (Addendum)
Patient declined flu vaccine. -Will schedule for mammogram and colonoscopy once patient has insurance -Will schedule for Pap smear next office visit, Tdap -Will check screening lipid panel
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
## 2013-01-23 NOTE — Assessment & Plan Note (Signed)
Adequately controlled. Since patient does have side effects from Norvasc including drowsiness, we will stop Norvasc. -Start lisinopril 10 mg by mouth qd -Continue clonidine 0.2 each bedtime given that it also control her hot flashes symptoms -Check bmet today and also will repeat bmp in 4 weeks

## 2013-01-24 LAB — COMPLETE METABOLIC PANEL WITH GFR
ALT: 15 U/L (ref 0–35)
Alkaline Phosphatase: 94 U/L (ref 39–117)
BUN: 14 mg/dL (ref 6–23)
Creat: 0.7 mg/dL (ref 0.50–1.10)
GFR, Est African American: >89 mL/min
Glucose, Bld: 79 mg/dL (ref 70–99)
Sodium: 139 mEq/L (ref 135–145)
Total Bilirubin: 0.5 mg/dL (ref 0.3–1.2)
Total Protein: 7 g/dL (ref 6.0–8.3)

## 2013-02-16 ENCOUNTER — Ambulatory Visit (HOSPITAL_COMMUNITY)
Admission: RE | Admit: 2013-02-16 | Discharge: 2013-02-16 | Disposition: A | Discharge status: Home / Self Care | Payer: No Typology Code available for payment source | Source: Ambulatory Visit | Attending: Internal Medicine | Admitting: Internal Medicine

## 2013-02-16 ENCOUNTER — Ambulatory Visit: Payer: Self-pay

## 2013-02-16 DIAGNOSIS — R079 Chest pain, unspecified: Secondary | ICD-10-CM | POA: Insufficient documentation

## 2013-02-16 DIAGNOSIS — R0781 Pleurodynia: Secondary | ICD-10-CM

## 2013-02-21 ENCOUNTER — Encounter: Payer: Self-pay | Admitting: Internal Medicine

## 2013-02-21 ENCOUNTER — Ambulatory Visit (INDEPENDENT_AMBULATORY_CARE_PROVIDER_SITE_OTHER): Payer: No Typology Code available for payment source | Admitting: Internal Medicine

## 2013-02-21 VITALS — BP 167/102 | HR 79 | Temp 97.4°F | Ht 62.0 in | Wt 126.3 lb

## 2013-02-21 DIAGNOSIS — J309 Allergic rhinitis, unspecified: Secondary | ICD-10-CM

## 2013-02-21 DIAGNOSIS — E785 Hyperlipidemia, unspecified: Secondary | ICD-10-CM

## 2013-02-21 DIAGNOSIS — I10 Essential (primary) hypertension: Secondary | ICD-10-CM

## 2013-02-21 MED ORDER — IBUPROFEN 200 MG PO TABS: 200.0000 mg | ORAL_TABLET | Freq: Four times a day (QID) | ORAL | Status: DC | PRN

## 2013-02-21 MED ORDER — CETIRIZINE HCL 10 MG PO TABS: 10.0000 mg | ORAL_TABLET | Freq: Every day | ORAL | Status: DC

## 2013-02-21 MED ORDER — SIMVASTATIN 40 MG PO TABS: 40.0000 mg | ORAL_TABLET | Freq: Every evening | ORAL | Status: DC

## 2013-02-21 MED ORDER — LISINOPRIL 20 MG PO TABS: 20.0000 mg | ORAL_TABLET | Freq: Every day | ORAL | Status: DC

## 2013-02-21 NOTE — Progress Notes (Signed)
[name], female   DOB: [birth year], 57 y.o.   MRN: [id number] HPI: Brittany Burns is a 57 yo woman with pMH of HTN, post menopausal hotflashes presents today for follow up. During last office visit, Norvasc was discontinued because it made her feel drowsy and lisinopril was started. She has no side effects from lisinopril. She states that she try diuretics in the past but did not like it because it made her urinate too much.  She is here to recheck her blood work today. Lipid panel showed an LDL of 160 and patient is not currently on statin. She states that she had a heart attack in the past in 2010?  She continues to have pain with certain movement especially bending in her chest area sometimes in the front and sometimes in the back.  She does want some medication for her allergy symptoms, she has runny nose and sneezing and itchy eyes. She was prescribed some allergy medication I helps her in the past but does not remember the name of this medication.  ROS: as per HPI  PE: General: alert, well-developed, and cooperative to examination.  Lungs: normal respiratory effort, no accessory muscle use, normal breath sounds, no crackles, and no wheezes. Heart: normal rate, regular rhythm, no murmur, no gallop, and no rub.  Abdomen: soft, non-tender, normal bowel sounds, no distention, no guarding, no rebound tenderness, no hepatomegaly, and no splenomegaly.  Neurologic: nonfocal Skin: turgor normal and no rashes.  Psych: appropriate

## 2013-02-21 NOTE — Assessment & Plan Note (Signed)
Patient now has orange card therefore will schedule for screening colonoscopy and mammogram.

## 2013-02-21 NOTE — Assessment & Plan Note (Signed)
Chest x-ray on 02/16/2013 did not show any acute fractures or abnormalities. She may have some muscle spasm with certain movements that would cause her to have pain. I recommended NSAIDs to see that would help.

## 2013-02-21 NOTE — Patient Instructions (Addendum)
Increase lisinopril to 20 mg daily You can take Zyrtec 10 mg daily for your allergy Start taking simvastatin 40 mg daily for high cholesterol You can take ibuprofen over-the-counter as needed for your pain Please schedule an appointment for Pap smear next office visit We will schedule for screening colonoscopy and mammogram Followup in 1-2 months

## 2013-02-21 NOTE — Assessment & Plan Note (Addendum)
BP Readings from Last 3 Encounters:  02/21/13 174/101  01/23/13 146/88  08/30/12 190/96    Lab Results  Component Value Date   NA 139 01/23/2013   K 4.0 01/23/2013   CREATININE 0.70 01/23/2013    Assessment:  Blood pressure control:  No  Progress toward BP goal:   No  Comments: Patient's currently taking clonidine for her hot flashes and only take at night Plan:  Medications: Will increase Lisinopril to 20 mg by mouth daily. Will consider adding a low-dose hydrochlorothiazide 12.5 mg daily for synergistic effect if her blood pressure continues to be resistant.  Educational resources provided:   yes  Self management tools provided:  yes  Other plans: We'll repeat BMP today to make sure that her potassiums are within normal limit

## 2013-02-21 NOTE — Assessment & Plan Note (Signed)
Symptoms consistent with allergic rhinitis including rhinorrhea, itchy eyes, sneezing. -Will try Zyrtec 10 mg daily, if this doesn't work we can try Allegra or Claritin in the future

## 2013-02-22 ENCOUNTER — Other Ambulatory Visit: Payer: Self-pay | Admitting: Internal Medicine

## 2013-02-22 LAB — BASIC METABOLIC PANEL WITH GFR
BUN: 15 mg/dL (ref 6–23)
Creat: 0.8 mg/dL (ref 0.50–1.10)
GFR, Est African American: >89 mL/min
GFR, Est Non African American: 84 mL/min

## 2013-02-22 NOTE — Assessment & Plan Note (Signed)
LDL 160 and pt reports hx of MI in the past.  Will need statin therapy. -start simvastatin 40mg  qhs -repeat lipid panel in 6 months

## 2013-03-03 ENCOUNTER — Ambulatory Visit (HOSPITAL_COMMUNITY)
Admission: RE | Admit: 2013-03-03 | Discharge: 2013-03-03 | Disposition: A | Discharge status: Home / Self Care | Payer: No Typology Code available for payment source | Source: Ambulatory Visit | Attending: Internal Medicine | Admitting: Internal Medicine

## 2013-03-03 DIAGNOSIS — Z0000 Encounter for general adult medical examination without abnormal findings: Secondary | ICD-10-CM

## 2013-03-03 DIAGNOSIS — Z1231 Encounter for screening mammogram for malignant neoplasm of breast: Secondary | ICD-10-CM | POA: Insufficient documentation

## 2013-03-05 ENCOUNTER — Emergency Department (HOSPITAL_COMMUNITY)
Admission: EM | Admit: 2013-03-05 | Discharge: 2013-03-05 | Disposition: A | Discharge status: Home / Self Care | Payer: No Typology Code available for payment source | Attending: Emergency Medicine | Admitting: Emergency Medicine

## 2013-03-05 ENCOUNTER — Encounter (HOSPITAL_COMMUNITY): Payer: Self-pay | Admitting: Emergency Medicine

## 2013-03-05 DIAGNOSIS — K219 Gastro-esophageal reflux disease without esophagitis: Secondary | ICD-10-CM | POA: Insufficient documentation

## 2013-03-05 DIAGNOSIS — R197 Diarrhea, unspecified: Secondary | ICD-10-CM | POA: Insufficient documentation

## 2013-03-05 DIAGNOSIS — R142 Eructation: Secondary | ICD-10-CM | POA: Insufficient documentation

## 2013-03-05 DIAGNOSIS — R300 Dysuria: Secondary | ICD-10-CM | POA: Insufficient documentation

## 2013-03-05 DIAGNOSIS — R110 Nausea: Secondary | ICD-10-CM | POA: Insufficient documentation

## 2013-03-05 DIAGNOSIS — R141 Gas pain: Secondary | ICD-10-CM | POA: Insufficient documentation

## 2013-03-05 DIAGNOSIS — F17200 Nicotine dependence, unspecified, uncomplicated: Secondary | ICD-10-CM | POA: Insufficient documentation

## 2013-03-05 DIAGNOSIS — R350 Frequency of micturition: Secondary | ICD-10-CM | POA: Insufficient documentation

## 2013-03-05 DIAGNOSIS — Z79899 Other long term (current) drug therapy: Secondary | ICD-10-CM | POA: Insufficient documentation

## 2013-03-05 DIAGNOSIS — R143 Flatulence: Secondary | ICD-10-CM | POA: Insufficient documentation

## 2013-03-05 DIAGNOSIS — I10 Essential (primary) hypertension: Secondary | ICD-10-CM | POA: Insufficient documentation

## 2013-03-05 LAB — CBC WITH DIFFERENTIAL/PLATELET
Basophils Relative: 0 % (ref 0–1)
HCT: 49.8 % — ABNORMAL HIGH (ref 36.0–46.0)
Hemoglobin: 18.2 g/dL — ABNORMAL HIGH (ref 12.0–15.0)
Lymphocytes Relative: 22 % (ref 12–46)
MCHC: 36.5 g/dL — ABNORMAL HIGH (ref 30.0–36.0)
Monocytes Absolute: 0.6 10*3/uL (ref 0.1–1.0)
Monocytes Relative: 6 % (ref 3–12)
Neutro Abs: 6.8 10*3/uL (ref 1.7–7.7)

## 2013-03-05 LAB — URINALYSIS, MICROSCOPIC ONLY
Ketones, ur: 15 mg/dL — AB
Leukocytes, UA: NEGATIVE
Nitrite: NEGATIVE
Protein, ur: >300 mg/dL — AB

## 2013-03-05 LAB — COMPREHENSIVE METABOLIC PANEL
BUN: 23 mg/dL (ref 6–23)
CO2: 25 mEq/L (ref 19–32)
Chloride: 94 mEq/L — ABNORMAL LOW (ref 96–112)
Creatinine, Ser: 0.73 mg/dL (ref 0.50–1.10)
GFR calc Af Amer: >90 mL/min (ref >90)
GFR calc non Af Amer: >90 mL/min (ref >90)
Total Bilirubin: 0.5 mg/dL (ref 0.3–1.2)

## 2013-03-05 LAB — LIPASE, BLOOD: Lipase: 25 U/L (ref 11–59)

## 2013-03-05 MED ORDER — CLONIDINE HCL 0.1 MG PO TABS
0.1000 mg | ORAL_TABLET | Freq: Once | ORAL | Status: DC
  Filled 2013-03-05: qty 1

## 2013-03-05 MED ORDER — GI COCKTAIL ~~LOC~~
30.0000 mL | Freq: Once | ORAL | Status: AC
  Administered 2013-03-05: 30 mL via ORAL
  Filled 2013-03-05: qty 30

## 2013-03-05 MED ORDER — CLONIDINE HCL 0.1 MG PO TABS
0.1000 mg | ORAL_TABLET | Freq: Once | ORAL | Status: AC
  Administered 2013-03-05: 0.1 mg via ORAL
  Filled 2013-03-05: qty 1

## 2013-03-05 MED ORDER — ONDANSETRON HCL 4 MG/2ML IJ SOLN
4.0000 mg | Freq: Once | INTRAMUSCULAR | Status: AC
  Administered 2013-03-05: 4 mg via INTRAVENOUS
  Filled 2013-03-05: qty 2

## 2013-03-05 MED ORDER — SODIUM CHLORIDE 0.9 % IV BOLUS (SEPSIS)
1000.0000 mL | Freq: Once | INTRAVENOUS | Status: AC
  Administered 2013-03-05: 1000 mL via INTRAVENOUS

## 2013-03-05 NOTE — ED Notes (Signed)
Pt attempted to give urine specimen but only gave a drop.

## 2013-03-05 NOTE — ED Notes (Addendum)
Pt reports "not feeling well." Symptoms onset yesterday morning. Pt had a recent change in BP medication. Pt c/o N/V/D. Pt able to tolerate water but not food. Pt reports lower abdominal pain with urination. Pt also reports increase urination and burning with urination.

## 2013-03-05 NOTE — ED Provider Notes (Signed)
History     CSN: [id number]  Arrival date & time 03/05/13  1538   First MD Initiated Contact with Patient 03/05/13 1633      Chief Complaint  Patient presents with  . Nausea  . Emesis  . Diarrhea   HPI  57 y/o female with history of HTN and GERD who presents with cc of feeling sick on her stomach. She states that for the past two days she has been "burping", "feeling nauseated", and has had loose stools. She states she has had similar symptoms in the past which she has attributed to her acid reflux. She states she has been taking her protonix as prescribed. She last had a normal bowel movement yesterday but continues to pass flatus.   Past Medical History  Diagnosis Date  . Hypertension   . Acid reflux     History reviewed. No pertinent past surgical history.  No family history on file.  History  Substance Use Topics  . Smoking status: Current Every Day Smoker -- 0.50 packs/day    Types: Cigarettes  . Smokeless tobacco: Not on file  . Alcohol Use: Yes     Comment: Occasional    OB History   Grav Para Term Preterm Abortions TAB SAB Ect Mult Living                 Review of Systems  Constitutional: Negative for fever and chills.  Respiratory: Negative for cough and shortness of breath.   Cardiovascular: Negative for chest pain.  Gastrointestinal: Positive for nausea and diarrhea. Negative for vomiting, abdominal pain and constipation.  Genitourinary: Positive for dysuria and frequency.  All other systems reviewed and are negative.   Allergies  Review of patient's allergies indicates no known allergies.  Home Medications   Current Outpatient Rx  Name  Route  Sig  Dispense  Refill  . cloNIDine (CATAPRES) 0.2 MG tablet   Oral   Take 0.2 mg by mouth at bedtime.         Marland Kitchen lisinopril (PRINIVIL,ZESTRIL) 20 MG tablet   Oral   Take 1 tablet (20 mg total) by mouth daily.   30 tablet   6   . pantoprazole (PROTONIX) 40 MG tablet   Oral   Take 1 tablet (40 mg  total) by mouth daily.   30 tablet   6   . simvastatin (ZOCOR) 40 MG tablet   Oral   Take 1 tablet (40 mg total) by mouth every evening.   30 tablet   6     BP 113/76  Pulse 105  Temp(Src) 98.9 F (37.2 C) (Oral)  Resp 26  SpO2 97%  Physical Exam  Nursing note and vitals reviewed. Constitutional: She is oriented to person, place, and time. She appears well-developed and well-nourished. No distress.  Belching during examination  HENT:  Head: Normocephalic and atraumatic.  Eyes: Conjunctivae are normal. Pupils are equal, round, and reactive to light.  Neck: Normal range of motion. Neck supple.  Cardiovascular: Normal rate and regular rhythm.  Exam reveals no gallop and no friction rub.   No murmur heard. Pulmonary/Chest: Effort normal and breath sounds normal.  Abdominal: Soft. She exhibits no distension and no pulsatile midline mass. There is no tenderness.  Musculoskeletal: Normal range of motion. She exhibits no edema and no tenderness.  Neurological: She is alert and oriented to person, place, and time. She has normal strength and normal reflexes. No cranial nerve deficit or sensory deficit. GCS eye subscore is 4.  GCS verbal subscore is 5. GCS motor subscore is 6.  Skin: Skin is warm and dry.  Psychiatric: She has a normal mood and affect.    ED Course  Procedures (including critical care time)  Labs Reviewed  CBC WITH DIFFERENTIAL - Abnormal; Notable for the following:    RBC 5.58 (*)    Hemoglobin 18.2 (*)    HCT 49.8 (*)    MCHC 36.5 (*)    All other components within normal limits  COMPREHENSIVE METABOLIC PANEL - Abnormal; Notable for the following:    Chloride 94 (*)    Glucose, Bld 130 (*)    Total Protein 8.8 (*)    All other components within normal limits  URINALYSIS, MICROSCOPIC ONLY - Abnormal; Notable for the following:    Specific Gravity, Urine >1.030 (*)    Hgb urine dipstick LARGE (*)    Bilirubin Urine SMALL (*)    Ketones, ur 15 (*)     Protein, ur >300 (*)    All other components within normal limits  LIPASE, BLOOD  TROPONIN I   No results found.  1. Nausea   2. Reflux     Date: 03/05/2013  Rate: 68  Rhythm: normal sinus rhythm  QRS Axis: left  Intervals: normal  ST/T Wave abnormalities: nonspecific ST changes  Conduction Disutrbances:left anterior fascicular block  Narrative Interpretation:   Old EKG Reviewed: none available  MDM   57 y/o female with history of HTN and GERD who presents with cc of feeling sick on her stomach. Well appearing. Hypertensive. Given dose of home clonidine. Labs unremarkable. EKG with non-specific changes. Troponin negative. Doubt anginal equivalent. Without vomiting and passing flatus doubt SBO. Benign abdominal exam. Doubt ACS. Doubt UTI. Likely GERD secondary to belching and reflux type symptoms. Will rehydrate with IVF, give antiemetics, and GI cocktail.   11:54 PM- Feeling better after antiemetic and IVF. States she is hungry.  The patient was discharged home in good condition.       Shanon Ace, MD 03/05/13 [phone]

## 2013-03-06 ENCOUNTER — Other Ambulatory Visit: Payer: Self-pay | Admitting: *Deleted

## 2013-03-07 ENCOUNTER — Encounter: Payer: Self-pay | Admitting: Internal Medicine

## 2013-03-07 ENCOUNTER — Ambulatory Visit (INDEPENDENT_AMBULATORY_CARE_PROVIDER_SITE_OTHER): Payer: No Typology Code available for payment source | Admitting: Internal Medicine

## 2013-03-07 VITALS — BP 107/73 | HR 74 | Temp 99.3°F | Ht 61.0 in | Wt 121.5 lb

## 2013-03-07 DIAGNOSIS — K921 Melena: Secondary | ICD-10-CM

## 2013-03-07 DIAGNOSIS — I639 Cerebral infarction, unspecified: Secondary | ICD-10-CM | POA: Insufficient documentation

## 2013-03-07 DIAGNOSIS — Z720 Tobacco use: Secondary | ICD-10-CM

## 2013-03-07 DIAGNOSIS — R109 Unspecified abdominal pain: Secondary | ICD-10-CM | POA: Insufficient documentation

## 2013-03-07 DIAGNOSIS — I10 Essential (primary) hypertension: Secondary | ICD-10-CM

## 2013-03-07 DIAGNOSIS — F17200 Nicotine dependence, unspecified, uncomplicated: Secondary | ICD-10-CM

## 2013-03-07 MED ORDER — CETIRIZINE HCL 10 MG PO TABS: 10.0000 mg | ORAL_TABLET | Freq: Every day | ORAL | Status: DC

## 2013-03-07 NOTE — Assessment & Plan Note (Signed)
Pertinent Data: BP Readings from Last 3 Encounters:  03/07/13 107/73  03/05/13 113/76  02/21/13 167/102    Basic Metabolic Panel:    Component Value Date/Time   NA 136 03/05/2013 1601   K 4.0 03/05/2013 1601   CL 94* 03/05/2013 1601   CO2 25 03/05/2013 1601   BUN 23 03/05/2013 1601   CREATININE 0.73 03/05/2013 1601   CREATININE 0.80 02/21/2013 1451   GLUCOSE 130* 03/05/2013 1601   CALCIUM 10.5 03/05/2013 1601    Assessment: Disease Control:  Controlled  Progress toward goals:  Improved  Barriers to meeting goals: no barriers identified    She has negative orthostatic vital signs. She describes mild dizziness since starting increased dose of Lisinopril on 3/18. I suspect this is secondary to her body equilibrating to such a large change in her BP from prior.    Patient is compliant most of the time with prescribed medications.   Plan:  continue current medications  Educational resources provided: brochure  Self management tools provided: home blood pressure logbook

## 2013-03-07 NOTE — Assessment & Plan Note (Addendum)
Pertinent Data:  CT Abd/ Pelvis (04/2010) - Normal CT of abdomen pelvis with no acute process identified.  Assessment: Patient's history of worsening abd belching, nausea is likely most consistent with acute exacerbation of her GERD after eating a fried chicken with hot sauce. She has been postmenopausal since 2012, and has not noted any vaginal discharge, postmenopausal bleeding. 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 Cause of these entities is unclear. Given her smoking history, there is a was concern about bladder or renal neoplasm - therefore, this warrants further investigation. As well, the patient indicates that she has had mild blood in her stools recently. She has not yet undergone screening colonoscopy. She has no family history of colon cancer.  Plan:      Fecal occult blood in negative.   Will get retroperitoneal ultrasound to evaluate further for renal cell carcinoma or renal pathologies. If abnormal, will consider erythropoietin levels.   GI referral for screening colonoscopy - we called [facility], however, pt has outstanding bill that she is not willing to work towards reconciling. Therefore, we cannot make this referral right now.  Will need to continue to encourage settling of bill for [facility] so she can complete colonoscopy as is needed.
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
## 2013-03-07 NOTE — Patient Instructions (Addendum)
General Instructions:  Please follow-up at the clinic in 1 month, at which time we will reevaluate your abdominal pain, smoking - OR, please follow-up in the clinic sooner if needed.  There have not been changes in your medications.   Get your abdominal ultrasound done as soon as possible - I will call you if the results are abnormal.  Please go see the stomach doctor as soon as you can - they will get you set up for a colonoscopy (camera that looks up your bottom at your intestines)  Keep taking your Lisinopril at your current dosage.  Work on decreasing your smoking. See tips below.  If symptoms worsen, or new symptoms arise, please call the clinic or go to the ER.  PLEASE BRING ALL OF YOUR MEDICATIONS  IN A BAG TO YOUR NEXT APPOINTMENT   Treatment Goals:  Goals (1 Years of Data) as of 03/07/13   None      Progress Toward Treatment Goals:    Self Care Goals & Plans:  Self Care Goal 03/07/2013  Manage my medications take my medicines as prescribed; bring my medications to every visit; refill my medications on time  Monitor my health keep track of my blood pressure  Eat healthy foods drink diet soda or water instead of juice or soda; eat foods that are low in salt; eat baked foods instead of fried foods; eat more vegetables  Be physically active -  Stop smoking go to the [employer]uitline[employer] website (PumpkinSearch.com.ee)     TIPS TO HELP YOU QUIT SMOKING 1-800-QUIT-NOW  This document explains the best ways for you to quit smoking and new treatments to help. It lists new medicines that can double or triple your chances of quitting and quitting for good. It also considers ways to avoid relapses and concerns you may have about quitting, including weight gain.   Nicotine: A Powerful Addiction If you have tried to quit smoking, you know how hard it can be. It is hard because nicotine is a very addictive drug. For some people, it can be as addictive as heroin or cocaine. Usually, people  make 2 or 3 tries, or more, before finally being able to quit. Each time you try to quit, you can learn about what helps and what hurts. Quitting takes hard work and a lot of effort, but you can quit smoking.   Quitting smoking is one of the most important things you will ever do You will live longer, feel better, and live better.  The impact on your body of quitting smoking is felt almost immediately:   Five keys to quitting: Studies have shown that these 5 steps will help you quit smoking and quit for good. You have the best chances of quitting if you use them together:   1. GET READY  Set a quit date.  Change your environment.  Get rid of ALL cigarettes, ashtrays, matches, and lighters in your home, car, and place of work.  Do not let people smoke in your home.  Review your past attempts to quit. Think about what worked and what did not.  Once you quit, do not smoke. NOT EVEN A PUFF!   2. GET SUPPORT AND ENCOURAGEMENT  Tell your family, friends, and coworkers that you are going to quit and need their support. Ask them not to smoke around you.  Get individual, group, or telephone counseling and support.  Many smokers find one or more of the many self-help books available useful in helping them quit  and stay off tobacco.   3. LEARN NEW SKILLS AND BEHAVIORS  Try to distract yourself from urges to smoke. Talk to someone, go for a walk, or occupy your time with a task.  When you first try to quit, change your routine. Take a different route to work. Drink tea instead of coffee. Eat breakfast in a different place.  Do something to reduce your stress. Take a hot bath, exercise, or read a book.  Plan something enjoyable to do every day. Reward yourself for not smoking.  Explore interactive web-based programs that specialize in helping you quit.   4. GET MEDICINE AND USE IT CORRECTLY .  Medicines can help you stop smoking and decrease the urge to smoke. Combining medicine with the above  behavioral methods and support can quadruple your chances of successfully quitting smoking.  Talk with your doctor about these options.  5. BE PREPARED FOR RELAPSE OR DIFFICULT SITUATIONS  Most relapses occur within the first 3 months after quitting. Do not be discouraged if you start smoking again. Remember, most people try several times before they finally quit.  You may have symptoms of withdrawal because your body is used to nicotine. You may crave cigarettes, be irritable, feel very hungry, cough often, get headaches, or have difficulty concentrating.  The withdrawal symptoms are only temporary. They are strongest when you first quit, but they will go away within 10 to 14 days.   Quitting takes hard work and a lot of effort, but you can quit smoking.   FOR MORE INFORMATION  Smokefree.gov (http://www.davis-sullivan.com/) provides free, accurate, evidence-based information and professional assistance to help support the immediate and long-term needs of people trying to quit smoking.  Document Released: 11/17/2001 Document Re-Released: 05/13/2010  [facility] Patient Information 2011 [place], [place].

## 2013-03-08 LAB — POC HEMOCCULT BLD/STL (OFFICE/1-CARD/DIAGNOSTIC): Fecal Occult Blood, POC: NEGATIVE

## 2013-03-08 NOTE — Telephone Encounter (Signed)
[facility] does not carry this, they only have clarinex, pt states that does not help only zyrtec, she does not at this time want the script called anywhere else, states she will look at the cost of OTC

## 2013-03-09 ENCOUNTER — Ambulatory Visit (HOSPITAL_COMMUNITY)
Admission: RE | Admit: 2013-03-09 | Discharge: 2013-03-09 | Disposition: A | Discharge status: Home / Self Care | Payer: No Typology Code available for payment source | Source: Ambulatory Visit | Attending: Internal Medicine | Admitting: Internal Medicine

## 2013-03-09 DIAGNOSIS — R634 Abnormal weight loss: Secondary | ICD-10-CM | POA: Insufficient documentation

## 2013-03-09 DIAGNOSIS — R109 Unspecified abdominal pain: Secondary | ICD-10-CM | POA: Insufficient documentation

## 2013-03-09 DIAGNOSIS — R319 Hematuria, unspecified: Secondary | ICD-10-CM | POA: Insufficient documentation

## 2013-03-09 DIAGNOSIS — F17200 Nicotine dependence, unspecified, uncomplicated: Secondary | ICD-10-CM | POA: Insufficient documentation

## 2013-03-09 DIAGNOSIS — D45 Polycythemia vera: Secondary | ICD-10-CM | POA: Insufficient documentation

## 2013-03-09 NOTE — Progress Notes (Signed)
INTERNAL MEDICINE TEACHING ATTENDING ADDENDUM - Inez Catalina, MD: I reviewed with the resident Dr. Saralyn Pilar, Ms. Diefenderfer's  medical history, physical examination, diagnosis and results of tests and treatment and I agree with the patient's care as documented.

## 2013-03-09 NOTE — ED Provider Notes (Signed)
I saw and evaluated the patient, reviewed the resident's note and I agree with the findings and plan.  I reviewed and interpreted the EKG during the patient's evaluation in the ED and agree with the resident's interpretation.  Pt's symptoms atypical for ACS.  Suspect related to GERD  Celene Kras, MD 03/09/13 [phone]

## 2013-03-14 NOTE — Addendum Note (Signed)
Addended by: Neomia Dear on: 03/14/2013 05:49 PM   Modules accepted: Orders

## 2013-03-31 ENCOUNTER — Other Ambulatory Visit: Payer: Self-pay | Admitting: *Deleted

## 2013-03-31 NOTE — Telephone Encounter (Signed)
Pt was using [facility], they have closed, she will need new scripts sent to [facility]

## 2013-04-03 ENCOUNTER — Other Ambulatory Visit: Payer: Self-pay | Admitting: Internal Medicine

## 2013-04-03 ENCOUNTER — Telehealth: Payer: Self-pay | Admitting: *Deleted

## 2013-04-03 MED ORDER — SIMVASTATIN 40 MG PO TABS: 40.0000 mg | ORAL_TABLET | Freq: Every evening | ORAL | Status: DC

## 2013-04-03 MED ORDER — CETIRIZINE HCL 10 MG PO TABS: 10.0000 mg | ORAL_TABLET | Freq: Every day | ORAL | Status: DC

## 2013-04-03 MED ORDER — OMEPRAZOLE 40 MG PO CPDR: DELAYED_RELEASE_CAPSULE | ORAL | Status: DC

## 2013-04-03 MED ORDER — CLONIDINE HCL 0.2 MG PO TABS: 0.2000 mg | ORAL_TABLET | Freq: Every day | ORAL | Status: DC

## 2013-04-03 MED ORDER — PANTOPRAZOLE SODIUM 40 MG PO TBEC: 40.0000 mg | DELAYED_RELEASE_TABLET | Freq: Every day | ORAL | Status: DC

## 2013-04-03 MED ORDER — LISINOPRIL 20 MG PO TABS: 20.0000 mg | ORAL_TABLET | Freq: Every day | ORAL | Status: DC

## 2013-04-03 NOTE — Telephone Encounter (Signed)
[facility] not [facility] protonix, can you change to omeprazole or famotidine?

## 2013-04-04 NOTE — Progress Notes (Signed)
All Rx faxed to [facility]. Stanton Kidney Puja Caffey RN 04/04/13 10AM

## 2013-04-06 ENCOUNTER — Telehealth: Payer: Self-pay | Admitting: *Deleted

## 2013-04-06 NOTE — Telephone Encounter (Signed)
[facility] cannot get zyrtec, they only carry clarinex, would you consider a change?

## 2013-04-08 NOTE — Telephone Encounter (Signed)
clarinex is fine

## 2013-04-11 NOTE — Telephone Encounter (Signed)
I've already changed it to omeprazole on 4/28 and faxed it over to them already.  Please see medication list

## 2013-05-09 ENCOUNTER — Ambulatory Visit (INDEPENDENT_AMBULATORY_CARE_PROVIDER_SITE_OTHER): Payer: No Typology Code available for payment source | Admitting: Internal Medicine

## 2013-05-09 ENCOUNTER — Encounter: Payer: Self-pay | Admitting: Internal Medicine

## 2013-05-09 VITALS — BP 162/101 | HR 80 | Temp 98.2°F | Ht 61.0 in | Wt 117.6 lb

## 2013-05-09 DIAGNOSIS — Z0000 Encounter for general adult medical examination without abnormal findings: Secondary | ICD-10-CM

## 2013-05-09 DIAGNOSIS — E785 Hyperlipidemia, unspecified: Secondary | ICD-10-CM

## 2013-05-09 DIAGNOSIS — I10 Essential (primary) hypertension: Secondary | ICD-10-CM

## 2013-05-09 DIAGNOSIS — Z124 Encounter for screening for malignant neoplasm of cervix: Secondary | ICD-10-CM

## 2013-05-09 MED ORDER — LISINOPRIL 10 MG PO TABS: 10.0000 mg | ORAL_TABLET | Freq: Every day | ORAL | Status: DC

## 2013-05-09 MED ORDER — AMLODIPINE BESYLATE 5 MG PO TABS: 5.0000 mg | ORAL_TABLET | Freq: Every day | ORAL | Status: DC

## 2013-05-09 NOTE — Patient Instructions (Addendum)
Take Lisinopril 10mg  one tablet daily Start Amolodipine 5mg  one tablet daily I will call you if your pap smear is abnormal  Follow up in 3 months

## 2013-05-09 NOTE — Assessment & Plan Note (Addendum)
I performed Pap smear today. As for colonoscopy, patient will need to pay her outstanding bill prior to to the GI office scheduling her an appointment for colonoscopy.

## 2013-05-09 NOTE — Assessment & Plan Note (Addendum)
BP Readings from Last 3 Encounters:  05/09/13 155/103  03/07/13 107/73  03/05/13 113/76    Lab Results  Component Value Date   NA 136 03/05/2013   K 4.0 03/05/2013   CREATININE 0.73 03/05/2013    Assessment: Blood pressure control:   yes Progress toward BP goal:    yes  Plan: Medications:  Continue lisinopril 10 mg by mouth daily. Patient was unable to tolerate Lisinopril 20 mg tablet do to drowsiness. She tells me that she is unable to take clonidine twice a day because that also made her feel drowsy. She is willing to try amlodipine 5 mg by mouth daily. Advise her to stop the medication if this also made her feel drowsy. Educational resources provided:   yes Self management tools provided:  Yes

## 2013-05-09 NOTE — Addendum Note (Signed)
Addended by: Maura Crandall on: 05/09/2013 02:52 PM   Modules accepted: Orders

## 2013-05-09 NOTE — Progress Notes (Signed)
[name], female   DOB: 26-Jan-1956, 57 y.o.   MRN: [id number] History of present illness: Brittany Burns is a 57 year old woman with past medical history of hypertension,GERD here today for PAP. She had some abdominal pain on 27th of May bc she drank some alcohol. She states that prior to this she was also drinking a lot of alcohol which exacerbated her reflux. Her abdominal pain is now completely resolved. As for her blood pressure, She has been out of her Lisinopril since yesterday. She only takes 10mg  of Lisinopril because 20mg  makes her drowsy. As she takes the clonidine at bedtime for hot flashes. She does not check her BP at home. She is here to for pap smear. She denies any discharge, dysuria, discomfort. Vision is not sexually active.  Review of system: As per history of present illness  Physical examination: General: alert, well-developed, and cooperative to examination.  Lungs: normal respiratory effort, no accessory muscle use, normal breath sounds, no crackles, and no wheezes. Heart: normal rate, regular rhythm, no murmur, no gallop, and no rub.  Abdomen: soft, non-tender, normal bowel sounds, no distention, no guarding, no rebound tenderness Neurologic: nonfocal Skin: turgor normal and no rashes.  GU: No vaginal masses or bleeding noted. No adnexal masses or cervical motion tenderness. Cervix is unremarkable. Psych: appropriate

## 2013-05-09 NOTE — Assessment & Plan Note (Addendum)
Continue simvastatin 20 mg by mouth each bedtime.  Will need to repeat lipid panel in February 2015.

## 2013-05-10 NOTE — Progress Notes (Signed)
INTERNAL MEDICINE TEACHING ATTENDING ADDENDUM: I discussed this case with Dr. Ho soon after the patient visit. I agree with her HPI, exam findings and diagnoses. I have read her documentation and I agree with her plan of care. Please see the resident note above for details of management.   

## 2013-08-04 ENCOUNTER — Ambulatory Visit (INDEPENDENT_AMBULATORY_CARE_PROVIDER_SITE_OTHER): Payer: No Typology Code available for payment source | Admitting: Internal Medicine

## 2013-08-04 ENCOUNTER — Ambulatory Visit (HOSPITAL_COMMUNITY)
Admission: RE | Admit: 2013-08-04 | Discharge: 2013-08-04 | Disposition: A | Discharge status: Home / Self Care | Payer: No Typology Code available for payment source | Source: Ambulatory Visit | Attending: Internal Medicine | Admitting: Internal Medicine

## 2013-08-04 ENCOUNTER — Encounter: Payer: Self-pay | Admitting: Internal Medicine

## 2013-08-04 ENCOUNTER — Ambulatory Visit: Payer: Self-pay

## 2013-08-04 VITALS — BP 124/93 | HR 74 | Temp 97.9°F | Ht 59.5 in | Wt 115.7 lb

## 2013-08-04 DIAGNOSIS — I639 Cerebral infarction, unspecified: Secondary | ICD-10-CM

## 2013-08-04 DIAGNOSIS — M25529 Pain in unspecified elbow: Secondary | ICD-10-CM | POA: Insufficient documentation

## 2013-08-04 DIAGNOSIS — E785 Hyperlipidemia, unspecified: Secondary | ICD-10-CM

## 2013-08-04 DIAGNOSIS — Z0000 Encounter for general adult medical examination without abnormal findings: Secondary | ICD-10-CM

## 2013-08-04 DIAGNOSIS — F17200 Nicotine dependence, unspecified, uncomplicated: Secondary | ICD-10-CM

## 2013-08-04 DIAGNOSIS — M79601 Pain in right arm: Secondary | ICD-10-CM

## 2013-08-04 DIAGNOSIS — M79609 Pain in unspecified limb: Secondary | ICD-10-CM

## 2013-08-04 DIAGNOSIS — Z720 Tobacco use: Secondary | ICD-10-CM

## 2013-08-04 DIAGNOSIS — I10 Essential (primary) hypertension: Secondary | ICD-10-CM

## 2013-08-04 DIAGNOSIS — M79631 Pain in right forearm: Secondary | ICD-10-CM

## 2013-08-04 DIAGNOSIS — Z1211 Encounter for screening for malignant neoplasm of colon: Secondary | ICD-10-CM

## 2013-08-04 DIAGNOSIS — I6350 Cerebral infarction due to unspecified occlusion or stenosis of unspecified cerebral artery: Secondary | ICD-10-CM

## 2013-08-04 DIAGNOSIS — K219 Gastro-esophageal reflux disease without esophagitis: Secondary | ICD-10-CM

## 2013-08-04 MED ORDER — SIMVASTATIN 20 MG PO TABS: 20.0000 mg | ORAL_TABLET | Freq: Every evening | ORAL | Status: DC

## 2013-08-04 MED ORDER — ASPIRIN EC 81 MG PO TBEC: 81.0000 mg | DELAYED_RELEASE_TABLET | Freq: Every day | ORAL | Status: AC

## 2013-08-04 MED ORDER — TRAMADOL HCL 50 MG PO TABS: 50.0000 mg | ORAL_TABLET | Freq: Four times a day (QID) | ORAL | Status: DC | PRN

## 2013-08-04 NOTE — Patient Instructions (Addendum)
-  Start taking simvastatin 20mg  every day for your cholesterol and to protect your heart.  -Start taking a baby aspirin (81mg ) every day for your heart.  -Continue taking amlodipine (Norvasc) and clonidine everyday.  -You have been referred to Sports Medicine. We will call you with the appointment date and time.  -You need to have Xrays of your arm.  -Follow up with [place] in 3 months for your blood pressure.

## 2013-08-06 DIAGNOSIS — M79631 Pain in right forearm: Secondary | ICD-10-CM | POA: Insufficient documentation

## 2013-08-06 NOTE — Assessment & Plan Note (Signed)
Stable. Continue Prilosec PRN for heartburn.

## 2013-08-06 NOTE — Assessment & Plan Note (Signed)
  Assessment: Progress toward smoking cessation:   smoking less Barriers to progress toward smoking cessation:   improved Comments: Not ready to quit due to stress  Plan: Instruction/counseling given:  I counseled patient on the dangers of tobacco use, advised patient to stop smoking, and reviewed strategies to maximize success. Educational resources provided:  [employer]er[employer]ery) brochure Self management tools provided:    Medications to assist with smoking cessation:  None Patient agreed to the following self-care plans for smoking cessation:    Other plans: Continue cessation counseling.

## 2013-08-06 NOTE — Assessment & Plan Note (Signed)
Pt had stopped taking statin. Will restart simvastatin 40mg  daily.

## 2013-08-06 NOTE — Assessment & Plan Note (Signed)
Pt is due for colonoscopy.  -Referral to GI for colonoscopy.  -She declines the influenza or Tdap vaccine as she is scared of needles.

## 2013-08-06 NOTE — Assessment & Plan Note (Signed)
BP Readings from Last 3 Encounters:  08/04/13 124/93  05/09/13 162/101  03/07/13 107/73    Lab Results  Component Value Date   NA 136 03/05/2013   K 4.0 03/05/2013   CREATININE 0.73 03/05/2013    Assessment: Blood pressure control:  Controlled Progress toward BP goal:   At goal Comments: Pt is taking clonidine 0.2mg  qHS for hot flashes but this medication should be taken BID to prevent rebound HTN. Pt is also taking amlodipine 5mg  daily, has sopped taking Lisinopril 10mg  daily as this was causing her to have dizziness.   Plan: Medications:  Continue Norvasc 5mg  daily. Pt should take clonidine BID, could lower dose to 0.1mg  BID.  Educational resources provided: Comptroller tools provided:   Other plans: Continue Norvasc 5mg  daily, pt should take clonidine BID to prevent rebound HTN. Will contact pt in regards to clonidine tx.

## 2013-08-06 NOTE — Assessment & Plan Note (Signed)
Pt to start coated ASA 81mg  daily. Did not tolerated ASA 325mg  well in the past with heartburn.

## 2013-08-06 NOTE — Progress Notes (Signed)
  Subjective:    [name], female    DOB: [birth year], 57 y.o.   MRN: [id number]  HPI Brittany Burns is a 57 year old woman with PMH of HTN, GERD, hx of CVA who presents for evaluation of right arm pain. Her arm has been hurting for 3 days.  She states that she woke up one day with her arm hurting. She noticed mild swelling of the forearm at first but this has improved now. Her arm pain is relieved by wrapping with ACE band and worsens with arm flexion. She never had this pain before. She denies recent immobilization of the arm, long car rides, history of blood clots, or recent injury/trauma.    Review of Systems  Constitutional: Negative for fever, chills, diaphoresis, activity change, appetite change and fatigue.  Respiratory: Negative for cough, shortness of breath and wheezing.   Cardiovascular: Negative for chest pain, palpitations and leg swelling.  Gastrointestinal: Negative for abdominal pain.  Genitourinary: Negative for dysuria and frequency.  Musculoskeletal: Positive for myalgias.       Right forearm pain, chronic left rib pain.   Skin: Negative for color change, pallor, rash and wound.  Neurological: Negative for dizziness, syncope, weakness, light-headedness and headaches.  Hematological: Negative for adenopathy.  Psychiatric/Behavioral: Negative for behavioral problems and agitation.       Objective:   Physical Exam  Nursing note and vitals reviewed. Constitutional: She is oriented to person, place, and time. She appears well-developed and well-nourished. No distress.  HENT:  Head: Normocephalic and atraumatic.  Eyes: Conjunctivae are normal. No scleral icterus.  Cardiovascular: Normal rate and regular rhythm.   Pulmonary/Chest: Effort normal. No respiratory distress.  Musculoskeletal: She exhibits tenderness. She exhibits no edema.  Limited flexion of right arm secondary to pain. Flexion of the arm elicits pain in the medial aspect of the forearm,  ~2inches from the medial epicondyle.  Right forearm with TTP of the medial epicondyle but no ulnar nerve tenderness/tingling. No right elbow effusion, redness, on increased warmth, and no joint deformity.  Percussion of the right ulnar and median nerves does not elicit pain.  Normal ROM of right hand and right digits.  Right forearm skin is intact.  Sensation in right forearms, hand, and fingers is intact.    Neurological: She is alert and oriented to person, place, and time. Coordination normal.  Skin: Skin is warm and dry. No ecchymosis and no laceration noted. She is not diaphoretic. No erythema.  Psychiatric: She has a normal mood and affect.  Teary at times when discussing her financial hardships.           Assessment & Plan:

## 2013-08-06 NOTE — Assessment & Plan Note (Addendum)
Pt denies injury/trauma to arm, pain simply started, unprovoked. No median or ulnar nerve distribution of pain. The pain is more medial, perhaps medial epicondylitis but no tenderness of medial epicondyle or history to support this type of problem. No joint effusion, increased warmth, or erythema, no skin bruising. Pt not interested in pain medication -Ordered X-ray of right forearm and elbow which are negative for acute findings but concerning for osteopenia.  -Referred to Sports Medicine.  -Rx for Tramadol 50mg  q6h #30 for pain control.  -Pt has [employer], Voltaren gel not covered. Pt will try menthol muscle rub.  -Pt advised to continue using ACE band wrapping for pain relief.

## 2013-08-11 NOTE — Progress Notes (Signed)
Case discussed with Dr. Kennerly soon after the resident saw the patient.  We reviewed the resident's history and exam and pertinent patient test results.  I agree with the assessment, diagnosis, and plan of care documented in the resident's note. 

## 2013-08-15 ENCOUNTER — Telehealth: Payer: Self-pay | Admitting: Internal Medicine

## 2013-08-15 MED ORDER — CLONIDINE HCL 0.1 MG PO TABS: 0.1000 mg | ORAL_TABLET | Freq: Two times a day (BID) | ORAL | Status: DC

## 2013-08-15 NOTE — Telephone Encounter (Signed)
Called Ms. Scalia in regards to her clonidine. She takes this medication for hot flushes and has has good results.  She states that she was taking 0.2mg  BID before but this medicine made her drowsy during the day so she has been taking it only at night. I explained to her that this medicine may cause rebound hypertension when taken only once per day which can predispose her to a stroke or heart attack. She informed me that her BP is usually high during the day and would like to try a lower dose of clonidine twice per day.  Of note, her right arm pain is improving. I informed her of the results of her arm Xray which showed no fracture or dislocation. She has a Sports Medicine appointment on 9/15 for this pain.  Rx clonidine 0.1mg  BID [facility]  If she has daytime drowsiness with this medication BID, will consider discontinuing it and starting tx with venlafaxine for hot flashes.

## 2013-08-16 ENCOUNTER — Telehealth: Payer: Self-pay | Admitting: *Deleted

## 2013-08-16 NOTE — Telephone Encounter (Signed)
Pt called upset about meds - taking clonidine for hot flashes  and amlodipne for BP. Pt would like to talk with Dr Garald Braver. Message left. Stanton Kidney Swanson Farnell RN 08/16/13 3PM

## 2013-08-17 ENCOUNTER — Ambulatory Visit: Payer: Self-pay

## 2013-08-17 NOTE — Telephone Encounter (Signed)
Called pt. All she wanted to know was if she could take the clonidine and the amlodipine together in the morning which should be fine for her. Again, I explained that clonidine should be taken twice daily, 12 hours apart. i explained to her that if she feels dizzy with low blood pressure she could stop taking the amlodipine. She has a follow up appointment in 3 weeks.  I asked her to call us back if she had any other questions. She verbalized understanding.

## 2013-08-17 NOTE — Telephone Encounter (Signed)
Pt just wants to know if she can take clonidine and amlodipine at the same time.

## 2013-08-21 ENCOUNTER — Ambulatory Visit: Payer: Self-pay | Admitting: Family Medicine

## 2013-08-25 ENCOUNTER — Ambulatory Visit: Payer: Self-pay | Admitting: Family Medicine

## 2013-08-25 NOTE — Addendum Note (Signed)
Addended by: Neomia Dear on: 08/25/2013 03:51 PM   Modules accepted: Orders

## 2013-08-30 ENCOUNTER — Ambulatory Visit: Payer: Self-pay

## 2013-08-31 ENCOUNTER — Ambulatory Visit: Payer: No Typology Code available for payment source

## 2013-08-31 ENCOUNTER — Encounter: Payer: Self-pay | Admitting: Internal Medicine

## 2013-09-08 ENCOUNTER — Ambulatory Visit (INDEPENDENT_AMBULATORY_CARE_PROVIDER_SITE_OTHER): Payer: No Typology Code available for payment source | Admitting: Family Medicine

## 2013-09-08 ENCOUNTER — Encounter: Payer: Self-pay | Admitting: Family Medicine

## 2013-09-08 VITALS — BP 135/92 | HR 73 | Ht 61.0 in | Wt 115.0 lb

## 2013-09-08 DIAGNOSIS — M79609 Pain in unspecified limb: Secondary | ICD-10-CM

## 2013-09-08 DIAGNOSIS — M79631 Pain in right forearm: Secondary | ICD-10-CM

## 2013-09-11 NOTE — Progress Notes (Signed)
  Subjective:    [name], female    DOB: Nov 10, 1956, 57 y.o.   MRN: [id number]  HPI Right forearm pain for the last several weeks. No specific injury. Right-hand dominant. As noted no redness or swelling of the hand. Hurts most when she flexes the arm. Denies numbness or tingling in the hand. Has noted no skin color changes.   Review of Systems Denies fever, sweats, chills.    Objective:   Physical Exam  GENERAL: Well-developed female no acute distress ARM: Right. Mildly tender to palpation in the flexor portion of the right forearm. Wrist flexion extension is full range of motion and intact strength. She has some pain with supination. Grip strength is normal. NEURO: Soft touch sensation is intact in the right upper extremity. VASCULAR: Radial pulse 2+ bilaterally equal.      Assessment & Plan:  #1. Right forearm pain. Unclear what this is, we'll put her on a restraining program and see her back in a couple weeks.

## 2013-10-27 ENCOUNTER — Encounter: Payer: Self-pay | Admitting: Family Medicine

## 2013-10-27 ENCOUNTER — Ambulatory Visit (INDEPENDENT_AMBULATORY_CARE_PROVIDER_SITE_OTHER): Payer: No Typology Code available for payment source | Admitting: Family Medicine

## 2013-10-27 VITALS — BP 134/83 | Ht 61.0 in | Wt 115.0 lb

## 2013-10-27 DIAGNOSIS — M1711 Unilateral primary osteoarthritis, right knee: Secondary | ICD-10-CM

## 2013-10-27 DIAGNOSIS — M1710 Unilateral primary osteoarthritis, unspecified knee: Secondary | ICD-10-CM

## 2013-10-27 DIAGNOSIS — M25561 Pain in right knee: Secondary | ICD-10-CM | POA: Insufficient documentation

## 2013-11-01 NOTE — Progress Notes (Signed)
   Subjective:    Patient ID: Arcola Jansky, female    DOB: 04/01/1956, 57 y.o.   MRN: [id number]  HPI  Worsening right knee pain. No specific injury. No prior surgery. Has had intermittent knee pain for quite a while. No locking or giving way. Has occasionally been using tramadol.  Review of Systems See history of present illness.    Objective:   Physical Exam  Gen. well-developed female no acute distress : Knee: Right. Crepitus. Full range of motion flexion and extension. Ligaments intact. No effusion. No jointline tenderness.      Assessment & Plan:  #1. We'll get x-rays of her knee in the see her back. A little unclear as to exactly what's going on

## 2013-11-06 ENCOUNTER — Encounter: Payer: Self-pay | Admitting: Internal Medicine

## 2013-11-22 ENCOUNTER — Encounter: Payer: Self-pay | Admitting: Internal Medicine

## 2014-01-08 ENCOUNTER — Encounter: Payer: Self-pay | Admitting: Internal Medicine

## 2014-02-05 ENCOUNTER — Ambulatory Visit (INDEPENDENT_AMBULATORY_CARE_PROVIDER_SITE_OTHER): Payer: No Typology Code available for payment source | Admitting: Internal Medicine

## 2014-02-05 VITALS — BP 153/104 | HR 86 | Temp 98.6°F | Wt 119.4 lb

## 2014-02-05 DIAGNOSIS — Z720 Tobacco use: Secondary | ICD-10-CM

## 2014-02-05 DIAGNOSIS — Z1239 Encounter for other screening for malignant neoplasm of breast: Secondary | ICD-10-CM

## 2014-02-05 DIAGNOSIS — Z0000 Encounter for general adult medical examination without abnormal findings: Secondary | ICD-10-CM

## 2014-02-05 DIAGNOSIS — F17200 Nicotine dependence, unspecified, uncomplicated: Secondary | ICD-10-CM

## 2014-02-05 DIAGNOSIS — I10 Essential (primary) hypertension: Secondary | ICD-10-CM

## 2014-02-05 DIAGNOSIS — E785 Hyperlipidemia, unspecified: Secondary | ICD-10-CM

## 2014-02-05 DIAGNOSIS — Z9109 Other allergy status, other than to drugs and biological substances: Secondary | ICD-10-CM

## 2014-02-05 DIAGNOSIS — Z780 Asymptomatic menopausal state: Secondary | ICD-10-CM

## 2014-02-05 MED ORDER — CLONIDINE HCL 0.2 MG PO TABS: 0.2000 mg | ORAL_TABLET | Freq: Two times a day (BID) | ORAL | Status: DC

## 2014-02-05 NOTE — Patient Instructions (Signed)
-  Your blood pressure is elevated today.  -Start taking clonidine 0.2mg  in the morning and at night for your blood pressure and for your hot flashes.  -Keep a logbook with your blood pressure measures and bring this with you during your next appointment.  -Follow up with [place] within one month.

## 2014-02-06 ENCOUNTER — Encounter: Payer: Self-pay | Admitting: Internal Medicine

## 2014-02-06 ENCOUNTER — Telehealth: Payer: Self-pay | Admitting: Licensed Clinical Social Worker

## 2014-02-06 DIAGNOSIS — Z9109 Other allergy status, other than to drugs and biological substances: Secondary | ICD-10-CM | POA: Insufficient documentation

## 2014-02-06 NOTE — Assessment & Plan Note (Signed)
  Assessment: Progress toward smoking cessation:   smoking less Barriers to progress toward smoking cessation:   withdrawals symptoms Comments: she declines pharmacological interventions  Plan: Instruction/counseling given:  I counseled patient on the dangers of tobacco use, advised patient to stop smoking, and reviewed strategies to maximize success. Educational resources provided:  [employer] Insurance account manager) brochure Self management tools provided:  smoking cessation plan (STAR Quit Plan) Medications to assist with smoking cessation:  None Patient agreed to the following self-care plans for smoking cessation: go to the [employer] (www.quitlinenc.com);call [facility] (1-800-QUIT-NOW);cut down the number of cigarettes smoked  Other plans: Continue providing smoking cessation counseling.

## 2014-02-06 NOTE — Assessment & Plan Note (Signed)
She assures compliance with her statin, declines lipid panel or other blood work (including HgA1C) during this visit.

## 2014-02-06 NOTE — Assessment & Plan Note (Addendum)
She is due for a mammogram and for a Dexa scan and we will attempt to schedule these for her.  Stool cards were given to her during this visit.  She declined vaccinations during this visit.

## 2014-02-06 NOTE — Telephone Encounter (Signed)
Ms. Buford was referred to CSW for assistance with home BP monitor.  Pt is uninsured.  CSW inquired if [facility] uninsured program would be of assistance.  [facility] inquired if pt would like CM and referral must be made.  CSW placed call to Ms. Coralyn Mark discussed referral to [employer].  Pt in agreement for referral.  Referral faxed to [facility] to [facility], uninsured program.

## 2014-02-06 NOTE — Progress Notes (Signed)
   Subjective:    [name], female    DOB: 11/07/1956, 58 y.o.   MRN: [id number]  Hypertension Pertinent negatives include no chest pain, headaches, palpitations or shortness of breath.   Brittany Burns is a pleasant 58 yo woman with PMH significant for tobacco abuse and HTN who presents for follow up visit for her hypertension. In addition, she requests increase of her clonidine to further improve her nighttime hot flashes.    Review of Systems  Constitutional: Negative for fever, chills, diaphoresis, activity change, appetite change, fatigue and unexpected weight change.       Hot flashes which is a chronic problem  HENT: Negative for ear pain.   Eyes:       Watery eyes  Respiratory: Negative for cough, shortness of breath and wheezing.   Cardiovascular: Negative for chest pain, palpitations and leg swelling.  Gastrointestinal: Negative for abdominal pain.  Genitourinary: Negative for dysuria.  Allergic/Immunologic: Positive for environmental allergies.       Allergic rhinitis  Neurological: Negative for dizziness and headaches.  Psychiatric/Behavioral: Negative for behavioral problems, confusion and agitation.       Objective:   Physical Exam  Nursing note and vitals reviewed. Constitutional: She is oriented to person, place, and time. She appears well-developed and well-nourished. No distress.  Eyes: Conjunctivae are normal. Right eye exhibits no discharge. Left eye exhibits no discharge. No scleral icterus.  Neck: No thyromegaly present.  Cardiovascular: Normal rate and regular rhythm.   Pulmonary/Chest: Effort normal and breath sounds normal. No respiratory distress. She has no wheezes. She has no rales.  Abdominal: Soft. There is no tenderness.  Musculoskeletal: She exhibits no edema and no tenderness.  Neurological: She is alert and oriented to person, place, and time.  Skin: Skin is warm and dry. She is not diaphoretic. No erythema. No pallor.  Psychiatric:  She has a normal mood and affect. Her behavior is normal.          Assessment & Plan:

## 2014-02-06 NOTE — Assessment & Plan Note (Signed)
She will restart taking Zyrtec for her allergies.

## 2014-02-06 NOTE — Assessment & Plan Note (Addendum)
BP Readings from Last 3 Encounters:  02/05/14 153/104  10/27/13 134/83  09/08/13 135/92    Lab Results  Component Value Date   NA 136 03/05/2013   K 4.0 03/05/2013   CREATININE 0.73 03/05/2013    Assessment: Blood pressure control: mildly elevated Progress toward BP goal:  deteriorated Comments: she is on amlodipine 5mg  daily and clonidine 0.1mg  BID, Her blood pressure is elevated during this visit.   Plan: Medications:  continue current medications but increase her clonidine to 0.2mg  BID for blood pressure control and for ongoing relief of her hot flashes (although this is an off label use of clonidine, she reports improvement of her symptoms with this medication). The patient states that she may not tolerate clonidine 0.2mg  during the day as this may cause increased drowsiness but she is willing to try this increased dose.  Educational resources provided: brochure Self management tools provided: home blood pressure logbook Other plans: She was asked to return within one month after checking her blood pressure at her local pharmacy and keeping a BP log book. Appreciate Kaye's assistance in finding out if the patient is eligible for a free home BP measuring device. If her BP is persistently elevated, she may need increase of her amlodipine or addition of a thiazide diuretic.

## 2014-02-07 NOTE — Progress Notes (Signed)
Case discussed with Dr. Kennerly soon after the resident saw the patient.  We reviewed the resident's history and exam and pertinent patient test results.  I agree with the assessment, diagnosis and plan of care documented in the resident's note. 

## 2014-02-12 ENCOUNTER — Other Ambulatory Visit: Payer: Self-pay | Admitting: Internal Medicine

## 2014-02-12 DIAGNOSIS — I10 Essential (primary) hypertension: Secondary | ICD-10-CM

## 2014-02-12 MED ORDER — BLOOD PRESSURE MONITOR AUTOMAT DEVI: Status: DC

## 2014-02-13 ENCOUNTER — Other Ambulatory Visit: Payer: Self-pay | Admitting: Internal Medicine

## 2014-02-19 NOTE — Progress Notes (Addendum)
Order placed in error and removed from pt's chart.Despina Hidden Cassady3/16/20151:56 PM

## 2014-02-19 NOTE — Addendum Note (Signed)
Addended by: Marcelino Duster on: 02/19/2014 01:57 PM   Modules accepted: Orders

## 2014-02-26 ENCOUNTER — Ambulatory Visit: Payer: Self-pay

## 2014-03-12 ENCOUNTER — Encounter: Payer: Self-pay | Admitting: Internal Medicine

## 2014-03-19 ENCOUNTER — Other Ambulatory Visit: Payer: Self-pay | Admitting: Internal Medicine

## 2014-03-19 ENCOUNTER — Encounter: Payer: Self-pay | Admitting: Internal Medicine

## 2014-03-19 ENCOUNTER — Ambulatory Visit (HOSPITAL_COMMUNITY)
Admission: RE | Admit: 2014-03-19 | Discharge: 2014-03-19 | Disposition: A | Discharge status: Home / Self Care | Payer: No Typology Code available for payment source | Source: Ambulatory Visit | Attending: Internal Medicine | Admitting: Internal Medicine

## 2014-03-19 ENCOUNTER — Telehealth: Payer: Self-pay | Admitting: Licensed Clinical Social Worker

## 2014-03-19 DIAGNOSIS — Z1239 Encounter for other screening for malignant neoplasm of breast: Secondary | ICD-10-CM

## 2014-03-19 DIAGNOSIS — Z1231 Encounter for screening mammogram for malignant neoplasm of breast: Secondary | ICD-10-CM

## 2014-03-19 DIAGNOSIS — M810 Age-related osteoporosis without current pathological fracture: Secondary | ICD-10-CM | POA: Insufficient documentation

## 2014-03-19 DIAGNOSIS — Z780 Asymptomatic menopausal state: Secondary | ICD-10-CM | POA: Insufficient documentation

## 2014-03-19 DIAGNOSIS — Z13820 Encounter for screening for osteoporosis: Secondary | ICD-10-CM | POA: Insufficient documentation

## 2014-03-19 NOTE — Telephone Encounter (Signed)
[facility] is able to provide pt with bp monitor but Ms. Shibley has yet to return call to [facility] and accept care management.  [facility] can assist only if pt is in agreement for care management.  CSW placed call to Ms. Buser as pt no showed appt last week.  CSW inquiring as to what barriers are in place and if [facility] can assist.  CSW placed called to pt.  CSW left message requesting return call. CSW provided contact hours and phone number.

## 2014-03-21 NOTE — Telephone Encounter (Signed)
CSW returned call from Ms. Aispuro. CSW discussed attempts from [facility] care manager, L Broadnax to contact pt for CM assistance and bp monitoring kit.  Pt in agreement and CM services and CSW provided Ms. Coralyn Mark with care manager name and phone number.  CSW inquired about potential barriers to care, pt states she just could not make appointment and did attend to her  Mammogram appt.  Pt denies add'l barriers and is aware CSW is available to assist as needed.

## 2014-04-02 ENCOUNTER — Encounter: Payer: Self-pay | Admitting: Internal Medicine

## 2014-04-02 ENCOUNTER — Ambulatory Visit (INDEPENDENT_AMBULATORY_CARE_PROVIDER_SITE_OTHER): Payer: No Typology Code available for payment source | Admitting: Internal Medicine

## 2014-04-02 VITALS — BP 190/105 | HR 60 | Temp 98.0°F | Wt 121.9 lb

## 2014-04-02 DIAGNOSIS — E785 Hyperlipidemia, unspecified: Secondary | ICD-10-CM

## 2014-04-02 DIAGNOSIS — R1011 Right upper quadrant pain: Secondary | ICD-10-CM

## 2014-04-02 DIAGNOSIS — M810 Age-related osteoporosis without current pathological fracture: Secondary | ICD-10-CM

## 2014-04-02 DIAGNOSIS — Z0000 Encounter for general adult medical examination without abnormal findings: Secondary | ICD-10-CM

## 2014-04-02 DIAGNOSIS — I10 Essential (primary) hypertension: Secondary | ICD-10-CM

## 2014-04-02 DIAGNOSIS — K219 Gastro-esophageal reflux disease without esophagitis: Secondary | ICD-10-CM

## 2014-04-02 DIAGNOSIS — F17200 Nicotine dependence, unspecified, uncomplicated: Secondary | ICD-10-CM

## 2014-04-02 DIAGNOSIS — Z720 Tobacco use: Secondary | ICD-10-CM

## 2014-04-02 MED ORDER — SIMVASTATIN 20 MG PO TABS: 20.0000 mg | ORAL_TABLET | Freq: Every evening | ORAL | Status: DC

## 2014-04-02 MED ORDER — AMLODIPINE BESYLATE 5 MG PO TABS: 5.0000 mg | ORAL_TABLET | Freq: Every day | ORAL | Status: DC

## 2014-04-02 MED ORDER — OMEPRAZOLE 40 MG PO CPDR: DELAYED_RELEASE_CAPSULE | ORAL | Status: DC

## 2014-04-02 NOTE — Patient Instructions (Addendum)
General Instructions: -Start taking Calcium 1200mg  per day and Vitamin D 400 units per day. You may take Oscal 1200mg /Vitamin D 400 units per day.  -Continue taking amlodipine 5mg  daily and clonidine 0.2mg  twice per day.  -Continue working on quitting smoking. You may call 1-800-QUIT-NOW for more information on how to quit smoking.  -Follow up with [place] in 1 month for reevaluation of your symptoms.   Please bring your medicines with you each time you come.   Medicines may be  Eye drops  Herbal   Vitamins  Pills  Seeing these help [place] take care of you.   Treatment Goals:  Goals (1 Years of Data) as of 04/02/14         As of Today 02/05/14 02/05/14 10/27/13 09/08/13     Blood Pressure    . Blood Pressure < 140/90  186/105 153/104 185/103 134/83 135/92      Progress Toward Treatment Goals:  Treatment Goal 04/02/2014  Blood pressure deteriorated  Stop smoking smoking less    Self Care Goals & Plans:  Self Care Goal 04/02/2014  Manage my medications take my medicines as prescribed; refill my medications on time  Monitor my health keep track of my blood pressure; bring my blood pressure log to each visit  Eat healthy foods eat foods that are low in salt; eat baked foods instead of fried foods  Be physically active find an activity I enjoy  Stop smoking go to the [employer] (https://scott-booker.info/); call [facility] (1-800-QUIT-NOW); cut down the number of cigarettes smoked    No flowsheet data found.   Care Management & Community Referrals:  Referral 04/02/2014  Referrals made for care management support none needed     Smoking Cessation, Tips for Success If you are ready to quit smoking, congratulations! You have chosen to help yourself be healthier. Cigarettes bring nicotine, tar, carbon monoxide, and other irritants into your body. Your lungs, heart, and blood vessels will be able to work better without these poisons. There are many different ways to quit smoking.  Nicotine gum, nicotine patches, a nicotine inhaler, or nicotine nasal spray can help with physical craving. Hypnosis, support groups, and medicines help break the habit of smoking. WHAT THINGS CAN I DO TO MAKE QUITTING EASIER?  Here are some tips to help you quit for good:  Pick a date when you will quit smoking completely. Tell all of your friends and family about your plan to quit on that date.  Do not try to slowly cut down on the number of cigarettes you are smoking. Pick a quit date and quit smoking completely starting on that day.  Throw away all cigarettes.   Clean and remove all ashtrays from your home, work, and car.   On a card, write down your reasons for quitting. Carry the card with you and read it when you get the urge to smoke.   Cleanse your body of nicotine. Drink enough water and fluids to keep your urine clear or pale yellow. Do this after quitting to flush the nicotine from your body.   Learn to predict your moods. Do not let a bad situation be your excuse to have a cigarette. Some situations in your life might tempt you into wanting a cigarette.   Never have "just one" cigarette. It leads to wanting another and another. Remind yourself of your decision to quit.   Change habits associated with smoking. If you smoked while driving or when feeling stressed, try other activities to  replace smoking. Stand up when drinking your coffee. Brush your teeth after eating. Sit in a different chair when you read the paper. Avoid alcohol while trying to quit, and try to drink fewer caffeinated beverages. Alcohol and caffeine may urge you to smoke.   Avoid foods and drinks that can trigger a desire to smoke, such as sugary or spicy foods and alcohol.   Ask people who smoke not to smoke around you.   Have something planned to do right after eating or having a cup of coffee. For example, plan to take a walk or exercise.   Try a relaxation exercise to calm you down and  decrease your stress. Remember, you may be tense and nervous for the first 2 weeks after you quit, but this will pass.   Find new activities to keep your hands busy. Play with a pen, coin, or rubber band. Doodle or draw things on paper.   Brush your teeth right after eating. This will help cut down on the craving for the taste of tobacco after meals. You can also try mouthwash.   Use oral substitutes in place of cigarettes. Try using lemon drops, carrots, cinnamon sticks, or chewing gum. Keep them handy so they are available when you have the urge to smoke.   When you have the urge to smoke, try deep breathing.   Designate your home as a nonsmoking area.   If you are a heavy smoker, ask your health care provider about a prescription for nicotine chewing gum. It can ease your withdrawal from nicotine.   Reward yourself. Set aside the cigarette money you save and buy yourself something nice.   Look for support from others. Join a support group or smoking cessation program. Ask someone at home or at work to help you with your plan to quit smoking.   Always ask yourself, "Do I need this cigarette or is this just a reflex?" Tell yourself, "Today, I choose not to smoke," or "I do not want to smoke." You are reminding yourself of your decision to quit.  Do not replace cigarette smoking with electronic cigarettes (commonly called e-cigarettes). The safety of e-cigarettes is unknown, and some may contain harmful chemicals.  If you relapse, do not give up! Plan ahead and think about what you will do the next time you get the urge to smoke.  HOW WILL I FEEL WHEN I QUIT SMOKING? You may have symptoms of withdrawal because your body is used to nicotine (the addictive substance in cigarettes). You may crave cigarettes, be irritable, feel very hungry, cough often, get headaches, or have difficulty concentrating. The withdrawal symptoms are only temporary. They are strongest when you first quit but  will go away within 10 14 days. When withdrawal symptoms occur, stay in control. Think about your reasons for quitting. Remind yourself that these are signs that your body is healing and getting used to being without cigarettes. Remember that withdrawal symptoms are easier to treat than the major diseases that smoking can cause.  Even after the withdrawal is over, expect periodic urges to smoke. However, these cravings are generally short lived and will go away whether you smoke or not. Do not smoke!  WHAT RESOURCES ARE AVAILABLE TO HELP ME QUIT SMOKING? Your health care provider can direct you to community resources or hospitals for support, which may include:  Group support.  Education.  Hypnosis.  Therapy. Document Released: 08/21/2004 Document Revised: 09/13/2013 Document Reviewed: 05/11/2013 [facility] Patient Information 2014 [place], [place].

## 2014-04-04 NOTE — Assessment & Plan Note (Signed)
  Assessment: Progress toward smoking cessation:  smoking less Barriers to progress toward smoking cessation:  withdrawal symptoms Comments: Continues to smoke the same amount  Plan: Instruction/counseling given:  I counseled patient on the dangers of tobacco use, advised patient to stop smoking, and reviewed strategies to maximize success. Educational resources provided:  [facility] [employer] account manager) brochure Self management tools provided:  smoking cessation plan ([employer] Quit Plan) Medications to assist with smoking cessation:  None Patient agreed to the following self-care plans for smoking cessation: go to the [employer] (www.quitlinenc.com);call [facility] (1-800-QUIT-NOW);cut down the number of cigarettes smoked  Other plans: Pt contemplating quiting

## 2014-04-04 NOTE — Assessment & Plan Note (Signed)
BP Readings from Last 3 Encounters:  04/02/14 190/105  02/05/14 153/104  10/27/13 134/83    Lab Results  Component Value Date   NA 136 03/05/2013   K 4.0 03/05/2013   CREATININE 0.73 03/05/2013    Assessment: Blood pressure control: moderately elevated Progress toward BP goal:  deteriorated Comments: She is on Norvasc 5mg  daily, clonidine 0.2mg  BID but ran out of these medications, took 0.1mg  clonidine the day prior to this visit with elevate BP during this visit.   Plan: Medications:  continue current medications Educational resources provided: brochure Self management tools provided: home blood pressure logbook Other plans: Follow up in 1 month

## 2014-04-04 NOTE — Progress Notes (Signed)
   Subjective:    [name], female    DOB: 07/04/1956, 58 y.o.   MRN: [id number]  Abdominal Pain Pertinent negatives include no constipation, diarrhea, dysuria, fever, frequency or headaches.   Brittany Burns is a 58 yr old woman with PMH significant for HTN, GERD, tobacco abuse, who presents for follow up for her hypertension. She also complains of right upper quadrant abdominal pain that has been present for at least 4 months. She is completely out of her blood pressure medications, she took clonidine 0.1mg  the day prior to this visit. She denies chest pain, headaches, blurry vision, SOB, or dizziness. She has had abdominal pain that occurs once or twice per month, lasts for a few minutes, is provoked by physical activity, and improves with rubbing of her right lower rib cage. The pain is described as sharp and cramping localized to her lower right ribcage. She first experienced the pain after spending hours bending down and standing at work. She denies bowel changes, N/V.    Review of Systems  Constitutional: Negative for fever, chills, diaphoresis, activity change, appetite change, fatigue and unexpected weight change.  Respiratory: Negative for cough and wheezing.   Cardiovascular: Negative for palpitations and leg swelling.  Gastrointestinal: Positive for abdominal pain. Negative for diarrhea, constipation and blood in stool.  Genitourinary: Negative for dysuria and frequency.  Musculoskeletal: Negative for back pain.  Neurological: Negative for dizziness, light-headedness and headaches.  Psychiatric/Behavioral: Negative for agitation.       Objective:   Physical Exam  Nursing note and vitals reviewed. Constitutional: She is oriented to person, place, and time. She appears well-developed and well-nourished. No distress.  Cardiovascular: Normal rate and regular rhythm.   Pulmonary/Chest: Effort normal and breath sounds normal. No respiratory distress. She has no wheezes.  She has no rales.  Abdominal: Soft. Bowel sounds are normal. She exhibits no distension and no mass. There is no tenderness. There is no rebound and no guarding.  She has prominent bilateral lower rib cage with no palpable mass, no bruise, no muscle spasm.   Musculoskeletal: She exhibits no edema and no tenderness.  Neurological: She is alert and oriented to person, place, and time.  Skin: Skin is warm and dry. No rash noted. She is not diaphoretic. No erythema.  Psychiatric: She has a normal mood and affect. Her behavior is normal.          Assessment & Plan:

## 2014-04-04 NOTE — Assessment & Plan Note (Signed)
She has not taken Prilosec in a while with increased belching.  Rx Prilosec again

## 2014-04-04 NOTE — Assessment & Plan Note (Signed)
Pt does not consume foods rich in Calcium, is lactose intolerant Pt advised to start taking calcium 1200mg  daily and Vitamin D 400units daily

## 2014-04-05 DIAGNOSIS — R1011 Right upper quadrant pain: Secondary | ICD-10-CM | POA: Insufficient documentation

## 2014-04-05 NOTE — Assessment & Plan Note (Signed)
Refilled Zocor 20mg  daily

## 2014-04-05 NOTE — Progress Notes (Signed)
INTERNAL MEDICINE TEACHING ATTENDING ADDENDUM - Mellissa Conley, MD: I reviewed and discussed at the time of visit with the resident Dr. Kennerly, the patient's medical history, physical examination, diagnosis and results of tests and treatment and I agree with the patient's care as documented. 

## 2014-04-05 NOTE — Assessment & Plan Note (Signed)
This does not happen often, resolves with rubbing of her abdominal wall, likely due to muscle overuse as it is proved by activity. Will continue monitoring.

## 2014-04-05 NOTE — Assessment & Plan Note (Signed)
She declined vaccination during this visit.  Declined colonoscopy, unclear if stool cards turned in.

## 2014-04-13 ENCOUNTER — Other Ambulatory Visit: Payer: Self-pay | Admitting: Internal Medicine

## 2014-04-13 DIAGNOSIS — Z1211 Encounter for screening for malignant neoplasm of colon: Secondary | ICD-10-CM

## 2014-04-19 ENCOUNTER — Encounter: Payer: Self-pay | Admitting: Internal Medicine

## 2014-05-07 ENCOUNTER — Ambulatory Visit (INDEPENDENT_AMBULATORY_CARE_PROVIDER_SITE_OTHER): Payer: No Typology Code available for payment source | Admitting: Internal Medicine

## 2014-05-07 ENCOUNTER — Encounter: Payer: Self-pay | Admitting: Internal Medicine

## 2014-05-07 VITALS — BP 136/90 | HR 78 | Temp 98.1°F | Wt 120.2 lb

## 2014-05-07 DIAGNOSIS — Z720 Tobacco use: Secondary | ICD-10-CM

## 2014-05-07 DIAGNOSIS — F17200 Nicotine dependence, unspecified, uncomplicated: Secondary | ICD-10-CM

## 2014-05-07 DIAGNOSIS — Z0000 Encounter for general adult medical examination without abnormal findings: Secondary | ICD-10-CM

## 2014-05-07 DIAGNOSIS — M810 Age-related osteoporosis without current pathological fracture: Secondary | ICD-10-CM

## 2014-05-07 DIAGNOSIS — I10 Essential (primary) hypertension: Secondary | ICD-10-CM

## 2014-05-07 NOTE — Patient Instructions (Addendum)
General Instructions: -Good job on cutting back on smoking. Let us know if you need additional help quitting smoking.  -Follow up with [place] in 3 months.    Please bring your medicines with you each time you come.   Medicines may be  Eye drops  Herbal   Vitamins  Pills  Seeing these help [place] take care of you.   Treatment Goals:  Goals (1 Years of Data) as of 05/07/14         As of Today 04/02/14 04/02/14 02/05/14 02/05/14     Blood Pressure    . Blood Pressure < 140/90  136/90 190/105 186/105 153/104 185/103      Progress Toward Treatment Goals:  Treatment Goal 05/07/2014  Blood pressure at goal  Stop smoking smoking less    Self Care Goals & Plans:  Self Care Goal 05/07/2014  Manage my medications bring my medications to every visit  Monitor my health -  Eat healthy foods -  Be physically active -  Stop smoking go to the [employer] (https://scott-booker.info/); call [facility] (1-800-QUIT-NOW)  Meeting treatment goals maintain the current self-care plan    No flowsheet data found.   Care Management & Community Referrals:  Referral 04/02/2014  Referrals made for care management support none needed    Smoking Cessation Quitting smoking is important to your health and has many advantages. However, it is not always easy to quit since nicotine is a very addictive drug. Often times, people try 3 times or more before being able to quit. This document explains the best ways for you to prepare to quit smoking. Quitting takes hard work and a lot of effort, but you can do it. ADVANTAGES OF QUITTING SMOKING  You will live longer, feel better, and live better.  Your body will feel the impact of quitting smoking almost immediately.  Within 20 minutes, blood pressure decreases. Your pulse returns to its normal level.  After 8 hours, carbon monoxide levels in the blood return to normal. Your oxygen level increases.  After 24 hours, the chance of having a heart attack starts to  decrease. Your breath, hair, and body stop smelling like smoke.  After 48 hours, damaged nerve endings begin to recover. Your sense of taste and smell improve.  After 72 hours, the body is virtually free of nicotine. Your bronchial tubes relax and breathing becomes easier.  After 2 to 12 weeks, lungs can hold more air. Exercise becomes easier and circulation improves.  The risk of having a heart attack, stroke, cancer, or lung disease is greatly reduced.  After 1 year, the risk of coronary heart disease is cut in half.  After 5 years, the risk of stroke falls to the same as a nonsmoker.  After 10 years, the risk of lung cancer is cut in half and the risk of other cancers decreases significantly.  After 15 years, the risk of coronary heart disease drops, usually to the level of a nonsmoker.  If you are pregnant, quitting smoking will improve your chances of having a healthy baby.  The people you live with, especially any children, will be healthier.  You will have extra money to spend on things other than cigarettes. QUESTIONS TO THINK ABOUT BEFORE ATTEMPTING TO QUIT You may want to talk about your answers with your caregiver.  Why do you want to quit?  If you tried to quit in the past, what helped and what did not?  What will be the most difficult situations for  you after you quit? How will you plan to handle them?  Who can help you through the tough times? Your family? Friends? A caregiver?  What pleasures do you get from smoking? What ways can you still get pleasure if you quit? Here are some questions to ask your caregiver:  How can you help me to be successful at quitting?  What medicine do you think would be best for me and how should I take it?  What should I do if I need more help?  What is smoking withdrawal like? How can I get information on withdrawal? GET READY  Set a quit date.  Change your environment by getting rid of all cigarettes, ashtrays, matches,  and lighters in your home, car, or work. Do not let people smoke in your home.  Review your past attempts to quit. Think about what worked and what did not. GET SUPPORT AND ENCOURAGEMENT You have a better chance of being successful if you have help. You can get support in many ways.  Tell your family, friends, and co-workers that you are going to quit and need their support. Ask them not to smoke around you.  Get individual, group, or telephone counseling and support. Programs are available at [employer] and health centers. Call your local health department for information about programs in your area.  Spiritual beliefs and practices may help some smokers quit.  Download a "quit meter" on your computer to keep track of quit statistics, such as how long you have gone without smoking, cigarettes not smoked, and money saved.  Get a self-help book about quitting smoking and staying off of tobacco. [place] yourself from urges to smoke. Talk to someone, go for a walk, or occupy your time with a task.  Change your normal routine. Take a different route to work. Drink tea instead of coffee. Eat breakfast in a different place.  Reduce your stress. Take a hot bath, exercise, or read a book.  Plan something enjoyable to do every day. Reward yourself for not smoking.  Explore interactive web-based programs that specialize in helping you quit. GET MEDICINE AND USE IT CORRECTLY Medicines can help you stop smoking and decrease the urge to smoke. Combining medicine with the above behavioral methods and support can greatly increase your chances of successfully quitting smoking.  Nicotine replacement therapy helps deliver nicotine to your body without the negative effects and risks of smoking. Nicotine replacement therapy includes nicotine gum, lozenges, inhalers, nasal sprays, and skin patches. Some may be available over-the-counter and others require a  prescription.  Antidepressant medicine helps people abstain from smoking, but how this works is unknown. This medicine is available by prescription.  Nicotinic receptor partial agonist medicine simulates the effect of nicotine in your brain. This medicine is available by prescription. Ask your caregiver for advice about which medicines to use and how to use them based on your health history. Your caregiver will tell you what side effects to look out for if you choose to be on a medicine or therapy. Carefully read the information on the package. Do not use any other product containing nicotine while using a nicotine replacement product.  RELAPSE OR DIFFICULT SITUATIONS Most relapses occur within the first 3 months after quitting. Do not be discouraged if you start smoking again. Remember, most people try several times before finally quitting. You may have symptoms of withdrawal because your body is used to nicotine. You may crave cigarettes, be irritable,  feel very hungry, cough often, get headaches, or have difficulty concentrating. The withdrawal symptoms are only temporary. They are strongest when you first quit, but they will go away within 10 14 days. To reduce the chances of relapse, try to:  Avoid drinking alcohol. Drinking lowers your chances of successfully quitting.  Reduce the amount of caffeine you consume. Once you quit smoking, the amount of caffeine in your body increases and can give you symptoms, such as a rapid heartbeat, sweating, and anxiety.  Avoid smokers because they can make you want to smoke.  Do not let weight gain distract you. Many smokers will gain weight when they quit, usually less than 10 pounds. Eat a healthy diet and stay active. You can always lose the weight gained after you quit.  Find ways to improve your mood other than smoking. FOR MORE INFORMATION  www.smokefree.gov  Document Released: 11/17/2001 Document Revised: 05/24/2012 Document Reviewed:  03/03/2012 [facility] Patient Information 2014 [place], [place].

## 2014-05-09 NOTE — Assessment & Plan Note (Signed)
She declines vaccinations again.  She has colonoscopy scheduled for this month.  Her mammogram on 03/19/14: BI-RADS CATEGORY 1 was Negative.

## 2014-05-09 NOTE — Assessment & Plan Note (Signed)
  Assessment: Progress toward smoking cessation:  smoking less Barriers to progress toward smoking cessation:  none Comments: She continues to smoke less with no set quit date, she is smoking 5 cigarettes per day from 1/2 pack to 1 pack per day.   Plan: Instruction/counseling given:  I counseled patient on the dangers of tobacco use, advised patient to stop smoking, and reviewed strategies to maximize success. Educational resources provided:    Self management tools provided:    Medications to assist with smoking cessation:  None Patient agreed to the following self-care plans for smoking cessation: go to the [employer] (www.quit[facility].com);call [facility] (1-800-QUIT-NOW)  Other plans: She will try to quit without pharmacological help but may try e-cigarettes.

## 2014-05-09 NOTE — Assessment & Plan Note (Addendum)
She has just started taking calcium and vitamin D.  -Will discuss tx options of bisphosphonate  v injections/infusions during her next visit. She has complained of severe GERD symptoms in the past that could be less favorable to oral bisphosphonate tx but will start PPI treatment.  -No recent falls with very low fall risk.

## 2014-05-09 NOTE — Progress Notes (Signed)
   Subjective:    [name], female    DOB: August 03, 1956, 58 y.o.   MRN: [id number]  Hypertension Pertinent negatives include no chest pain, headaches, palpitations or shortness of breath.   Brittany Burns is a 58 yr old woman with PMH of HTN, GERD, tobacco use, osteoporosis, who presents for follow up visit for her hypertension. She states that she has been taking her medications are prescribed and denies being dizzy or lightheaded. She is taking Vitamin D and Calcium supplementation. Her hot flashes continue to be well controlled with clonidine tx.    Review of Systems  Constitutional: Negative for chills, diaphoresis, activity change, appetite change and fatigue.  Respiratory: Negative for cough and shortness of breath.   Cardiovascular: Negative for chest pain, palpitations and leg swelling.  Gastrointestinal: Negative for abdominal pain and constipation.  Genitourinary: Negative for dysuria.  Neurological: Negative for dizziness, weakness, light-headedness and headaches.  Psychiatric/Behavioral: Negative for behavioral problems and agitation.       Objective:   Physical Exam  Nursing note and vitals reviewed. Constitutional: She is oriented to person, place, and time. She appears well-developed and well-nourished. No distress.  Eyes: Conjunctivae are normal. No scleral icterus.  Cardiovascular: Normal rate and regular rhythm.   Pulmonary/Chest: Effort normal. No respiratory distress.  Musculoskeletal: She exhibits no edema.  Neurological: She is alert and oriented to person, place, and time.  Skin: Skin is warm and dry. She is not diaphoretic.  Psychiatric: She has a normal mood and affect. Her behavior is normal.          Assessment & Plan:

## 2014-05-09 NOTE — Assessment & Plan Note (Signed)
BP Readings from Last 3 Encounters:  05/07/14 136/90  04/02/14 190/105  02/05/14 153/104    Lab Results  Component Value Date   NA 136 03/05/2013   K 4.0 03/05/2013   CREATININE 0.73 03/05/2013    Assessment: Blood pressure control: controlled Progress toward BP goal:  at goal  Comments: She is on amlodipine 5 mg daily, and clonidine 0.2mg  BID (for HTN and off-label use for hot flashes) with BP well controlled.   Plan: Medications:  continue current medications Educational resources provided:   Self management tools provided:   Other plans: She was advised to call us if she has any problems filling her R[facility] the [facility] to prevent her form running out of her antihypertensives.

## 2014-05-09 NOTE — Progress Notes (Signed)
Case discussed with Dr. Kennerly at the time of the visit.  We reviewed the resident's history and exam and pertinent patient test results.  I agree with the assessment, diagnosis, and plan of care documented in the resident's note. 

## 2014-06-07 ENCOUNTER — Encounter: Payer: Self-pay | Admitting: Internal Medicine

## 2014-06-14 ENCOUNTER — Encounter: Payer: Self-pay | Admitting: Internal Medicine

## 2014-06-14 ENCOUNTER — Ambulatory Visit (INDEPENDENT_AMBULATORY_CARE_PROVIDER_SITE_OTHER): Payer: No Typology Code available for payment source | Admitting: Internal Medicine

## 2014-06-14 VITALS — BP 100/66 | HR 64 | Ht 61.0 in | Wt 119.6 lb

## 2014-06-14 DIAGNOSIS — K219 Gastro-esophageal reflux disease without esophagitis: Secondary | ICD-10-CM

## 2014-06-14 DIAGNOSIS — R109 Unspecified abdominal pain: Secondary | ICD-10-CM

## 2014-06-14 DIAGNOSIS — Z1211 Encounter for screening for malignant neoplasm of colon: Secondary | ICD-10-CM

## 2014-06-14 DIAGNOSIS — R1030 Lower abdominal pain, unspecified: Secondary | ICD-10-CM

## 2014-06-14 DIAGNOSIS — R1011 Right upper quadrant pain: Secondary | ICD-10-CM

## 2014-06-14 MED ORDER — BENEFIBER PO POWD: 17.0000 g | Freq: Every day | ORAL | Status: DC

## 2014-06-14 MED ORDER — HYOSCYAMINE SULFATE 0.125 MG SL SUBL
0.1250 mg | SUBLINGUAL_TABLET | SUBLINGUAL | Status: DC | PRN
Start: 2014-06-14 — End: 2014-08-09

## 2014-06-14 NOTE — Patient Instructions (Signed)
You have been scheduled for an endoscopy. Please follow written instructions given to you at your visit today. If you use inhalers (even only as needed), please bring them with you on the day of your procedure. Your physician has requested that you go to www.startemmi.com and enter the access code given to you at your visit today. This web site gives a general overview about your procedure. However, you should still follow specific instructions given to you by our office regarding your preparation for the procedure.  We have sent the following medications to your pharmacy for you to pick up at your convenience: [place] 2 tablespoons daily  You have been scheduled for an abdominal ultrasound at [facility] (1st floor of hospital) on 06/20/2014 at 9:00. Please arrive 15 minutes prior to your appointment for registration. Make certain not to have anything to eat or drink 6 hours prior to your appointment. Should you need to reschedule your appointment, please contact radiology at [phone]. This test typically takes about 30 minutes to perform.
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
## 2014-06-20 ENCOUNTER — Ambulatory Visit (HOSPITAL_COMMUNITY)
Admission: RE | Admit: 2014-06-20 | Discharge: 2014-06-20 | Disposition: A | Discharge status: Home / Self Care | Payer: No Typology Code available for payment source | Source: Ambulatory Visit | Attending: Internal Medicine | Admitting: Internal Medicine

## 2014-06-20 DIAGNOSIS — R1011 Right upper quadrant pain: Secondary | ICD-10-CM | POA: Insufficient documentation

## 2014-06-20 DIAGNOSIS — K219 Gastro-esophageal reflux disease without esophagitis: Secondary | ICD-10-CM

## 2014-06-26 ENCOUNTER — Other Ambulatory Visit (INDEPENDENT_AMBULATORY_CARE_PROVIDER_SITE_OTHER): Payer: No Typology Code available for payment source

## 2014-06-26 DIAGNOSIS — Z1211 Encounter for screening for malignant neoplasm of colon: Secondary | ICD-10-CM

## 2014-06-26 DIAGNOSIS — K219 Gastro-esophageal reflux disease without esophagitis: Secondary | ICD-10-CM

## 2014-06-26 DIAGNOSIS — R1011 Right upper quadrant pain: Secondary | ICD-10-CM

## 2014-06-26 LAB — CBC
HEMATOCRIT: 39.5 % (ref 36.0–46.0)
HEMOGLOBIN: 13.4 g/dL (ref 12.0–15.0)
MCHC: 33.9 g/dL (ref 30.0–36.0)
MCV: 93.1 fl (ref 78.0–100.0)
PLATELETS: 269 10*3/uL (ref 150.0–400.0)
RBC: 4.24 Mil/uL (ref 3.87–5.11)
RDW: 14.1 % (ref 11.5–15.5)
WBC: 6.2 10*3/uL (ref 4.0–10.5)

## 2014-06-26 LAB — COMPREHENSIVE METABOLIC PANEL
ALT: 18 U/L (ref 0–35)
AST: 21 U/L (ref 0–37)
Albumin: 3.9 g/dL (ref 3.5–5.2)
Alkaline Phosphatase: 68 U/L (ref 39–117)
BILIRUBIN TOTAL: 0.5 mg/dL (ref 0.2–1.2)
BUN: 20 mg/dL (ref 6–23)
CALCIUM: 9.1 mg/dL (ref 8.4–10.5)
CHLORIDE: 108 meq/L (ref 96–112)
CO2: 27 meq/L (ref 19–32)
CREATININE: 0.8 mg/dL (ref 0.4–1.2)
GFR: 96.77 mL/min (ref >60.00)
GLUCOSE: 91 mg/dL (ref 70–99)
Potassium: 3.3 mEq/L — ABNORMAL LOW (ref 3.5–5.1)
Sodium: 141 mEq/L (ref 135–145)
Total Protein: 6.9 g/dL (ref 6.0–8.3)

## 2014-07-16 ENCOUNTER — Other Ambulatory Visit: Payer: Self-pay | Admitting: Internal Medicine

## 2014-07-16 ENCOUNTER — Encounter: Payer: Self-pay | Admitting: Internal Medicine

## 2014-07-16 MED ORDER — MOVIPREP 100 G PO SOLR: 1.0000 | Freq: Once | ORAL | Status: DC

## 2014-07-16 NOTE — Telephone Encounter (Signed)
Error

## 2014-07-18 ENCOUNTER — Ambulatory Visit (AMBULATORY_SURGERY_CENTER): Payer: Self-pay | Admitting: Internal Medicine

## 2014-07-18 ENCOUNTER — Encounter: Payer: Self-pay | Admitting: Internal Medicine

## 2014-07-18 VITALS — BP 190/96 | HR 57 | Temp 97.9°F | Resp 29 | Ht 61.0 in | Wt 119.0 lb

## 2014-07-18 DIAGNOSIS — K2990 Gastroduodenitis, unspecified, without bleeding: Secondary | ICD-10-CM

## 2014-07-18 DIAGNOSIS — D126 Benign neoplasm of colon, unspecified: Secondary | ICD-10-CM

## 2014-07-18 DIAGNOSIS — K219 Gastro-esophageal reflux disease without esophagitis: Secondary | ICD-10-CM

## 2014-07-18 DIAGNOSIS — Z1211 Encounter for screening for malignant neoplasm of colon: Secondary | ICD-10-CM

## 2014-07-18 DIAGNOSIS — K2970 Gastritis, unspecified, without bleeding: Secondary | ICD-10-CM

## 2014-07-18 DIAGNOSIS — K2270 Barrett's esophagus without dysplasia: Secondary | ICD-10-CM

## 2014-07-18 DIAGNOSIS — R1011 Right upper quadrant pain: Secondary | ICD-10-CM

## 2014-07-18 MED ORDER — SODIUM CHLORIDE 0.9 % IV SOLN: 500.0000 mL | INTRAVENOUS | Status: DC

## 2014-07-18 NOTE — Op Note (Signed)
[place]  [address]. [place], [id number]   ENDOSCOPY PROCEDURE REPORT  PATIENT: Brittany Burns, Brittany Burns  MR#: [id number] BIRTHDATE: March 22, 1956 , 32  yrs. old GENDER: Female ENDOSCOPIST: Jerene Bears, MD PROCEDURE DATE:  07/18/2014 PROCEDURE:  EGD w/ biopsy for H.pylori and EGD w/ biopsy ASA CLASS:     Class II INDICATIONS:  history of GERD.   abdominal pain in the upper right quadrant. MEDICATIONS: MAC sedation, administered by CRNA and propofol (Diprivan) 400mg  IV TOPICAL ANESTHETIC: none  DESCRIPTION OF PROCEDURE: After the risks benefits and alternatives of the procedure were thoroughly explained, informed consent was obtained.  The LB [id number] endoscope was introduced through the mouth and advanced to the second portion of the duodenum. Without limitations.  The instrument was slowly withdrawn as the mucosa was fully examined.  ESOPHAGUS: An irregular Z-line was observed 36 cm from the incisors. Multiple biopsies were performed using cold forceps.  Sample obtained to rule out Barrett's esophagus.   A 3 cm hiatal hernia was noted.  STOMACH: Moderate acute gastritis (inflammation) was found in the gastric antrum.  There were erosions present.  Multiple biopsies were performed using cold forceps.  DUODENUM: Mild duodenal inflammation was found in the duodenal bulb. The duodenal mucosa showed no abnormalities in the 2nd part of the duodenum.  Retroflexed views revealed a hiatal hernia.     The scope was then withdrawn from the patient and the procedure completed.  COMPLICATIONS: There were no complications. ENDOSCOPIC IMPRESSION: 1.   Irregular Z-line was observed 36 cm from the incisors; multiple biopsies 2.   3 cm hiatal hernia 3.   Acute gastritis (inflammation) was found in the gastric antrum; multiple biopsies 4.   Duodenal inflammation was found in the duodenal bulb 5.   The duodenal mucosa showed no abnormalities in the 2nd part of the  duodenum  RECOMMENDATIONS: 1.  Await biopsy results 2.  Follow-up of helicobacter pylori status, treat if indicated 3.  Continue taking your PPI (antiacid medicine) once daily.  It is best to be taken 20-30 minutes prior to breakfast meal.  eSigned:  Jerene Bears, MD 07/18/2014 3:27 PM CC:The Patient; Adele Barthel, MD

## 2014-07-18 NOTE — Progress Notes (Signed)
Report to PACU, RN, vss, BBS= Clear.  

## 2014-07-18 NOTE — Op Note (Signed)
[place]  [address]. [place], [id number]   COLONOSCOPY PROCEDURE REPORT  PATIENT: Brittany, Burns  MR#: [id number] BIRTHDATE: 1956-07-23 , 38  yrs. old GENDER: Female ENDOSCOPIST: Jerene Bears, MD PROCEDURE DATE:  07/18/2014 PROCEDURE:   Colonoscopy with snare polypectomy First Screening Colonoscopy - Avg.  risk and is 50 yrs.  old or older Yes.  Prior Negative Screening - Now for repeat screening. N/A  History of Adenoma - Now for follow-up colonoscopy & has been > or = to 3 yrs.  N/A  Polyps Removed Today? Yes. ASA CLASS:   Class II INDICATIONS:average risk screening and first colonoscopy. MEDICATIONS: MAC sedation, administered by CRNA and propofol (Diprivan) 400mg  IV  DESCRIPTION OF PROCEDURE:   After the risks benefits and alternatives of the procedure were thoroughly explained, informed consent was obtained.  A digital rectal exam revealed no rectal mass.   The LB [id number]  endoscope was introduced through the anus and advanced to the cecum, which was identified by both the appendix and ileocecal valve. No adverse events experienced. The quality of the prep was good, using MoviPrep  The instrument was then slowly withdrawn as the colon was fully examined.   COLON FINDINGS: A pedunculated polyp measuring 10-15 mm in size was found in the sigmoid colon.  A polypectomy was performed using snare cautery.  The resection was complete and the polyp tissue was completely retrieved.   The colon mucosa was otherwise normal. Retroflexed views revealed internal hemorrhoids. The time to cecum=1 minutes 42 seconds.  Withdrawal time=10 minutes 04 seconds. The scope was withdrawn and the procedure completed. COMPLICATIONS: There were no complications.  ENDOSCOPIC IMPRESSION: 1.   Pedunculated polyp measuring 10-15 mm in size was found in the sigmoid colon; polypectomy was performed using snare cautery 2.   The colon mucosa was otherwise  normal  RECOMMENDATIONS: 1.  Hold aspirin, aspirin products, and anti-inflammatory medication for 2 weeks. 2.  Await pathology results 3.  Timing of repeat colonoscopy will be determined by pathology findings. 4.  You will receive a letter within 1-2 weeks with the results of your biopsy as well as final recommendations.  Please call my office if you have not received a letter after 3 weeks.   eSigned:  Jerene Bears, MD 07/18/2014 3:29 PM   cc: The Patient; Adele Barthel, MD

## 2014-07-18 NOTE — Patient Instructions (Signed)
Discharge instructions given with verbal understanding. Handouts on polyps, hiatal hernia and biopsies taken. Resume previous medications. YOU HAD AN ENDOSCOPIC PROCEDURE TODAY AT [facility]: Refer to the procedure report that was given to you for any specific questions about what was found during the examination.  If the procedure report does not answer your questions, please call your gastroenterologist to clarify.  If you requested that your care partner not be given the details of your procedure findings, then the procedure report has been included in a sealed envelope for you to review at your convenience later.  YOU SHOULD EXPECT: Some feelings of bloating in the abdomen. Passage of more gas than usual.  Walking can help get rid of the air that was put into your GI tract during the procedure and reduce the bloating. If you had a lower endoscopy (such as a colonoscopy or flexible sigmoidoscopy) you may notice spotting of blood in your stool or on the toilet paper. If you underwent a bowel prep for your procedure, then you may not have a normal bowel movement for a few days.  DIET: Your first meal following the procedure should be a light meal and then it is ok to progress to your normal diet.  A half-sandwich or bowl of soup is an example of a good first meal.  Heavy or fried foods are harder to digest and may make you feel nauseous or bloated.  Likewise meals heavy in dairy and vegetables can cause extra gas to form and this can also increase the bloating.  Drink plenty of fluids but you should avoid alcoholic beverages for 24 hours.  ACTIVITY: Your care partner should take you home directly after the procedure.  You should plan to take it easy, moving slowly for the rest of the day.  You can resume normal activity the day after the procedure however you should NOT DRIVE or use heavy machinery for 24 hours (because of the sedation medicines used during the test).    SYMPTOMS TO  REPORT IMMEDIATELY: A gastroenterologist can be reached at any hour.  During normal business hours, 8:30 AM to 5:00 PM Monday through Friday, call [phone].  After hours and on weekends, please call the GI answering service at [phone] who will take a message and have the physician on call contact you.   Following lower endoscopy (colonoscopy or flexible sigmoidoscopy):  Excessive amounts of blood in the stool  Significant tenderness or worsening of abdominal pains  Swelling of the abdomen that is new, acute  Fever of 100F or higher  Following upper endoscopy (EGD)  Vomiting of blood or coffee ground material  New chest pain or pain under the shoulder blades  Painful or persistently difficult swallowing  New shortness of breath  Fever of 100F or higher  Black, tarry-looking stools  FOLLOW UP: If any biopsies were taken you will be contacted by phone or by letter within the next 1-3 weeks.  Call your gastroenterologist if you have not heard about the biopsies in 3 weeks.  Our staff will call the home number listed on your records the next business day following your procedure to check on you and address any questions or concerns that you may have at that time regarding the information given to you following your procedure. This is a courtesy call and so if there is no answer at the home number and we have not heard from you through the emergency physician on call, we will assume  that you have returned to your regular daily activities without incident.  SIGNATURES/CONFIDENTIALITY: You and/or your care partner have signed paperwork which will be entered into your electronic medical record.  These signatures attest to the fact that that the information above on your After Visit Summary has been reviewed and is understood.  Full responsibility of the confidentiality of this discharge information lies with you and/or your care-partner.

## 2014-07-18 NOTE — Progress Notes (Signed)
Called to room to assist during endoscopic procedure.  Patient ID and intended procedure confirmed with present staff. Received instructions for my participation in the procedure from the performing physician.  

## 2014-07-20 ENCOUNTER — Telehealth: Payer: Self-pay | Admitting: *Deleted

## 2014-07-20 NOTE — Telephone Encounter (Signed)
  Follow up Call-  Call back number 07/18/2014  Post procedure Call Back phone  # [phone]  Permission to leave phone message Yes  comments no voicemail     Patient questions:  Do you have a fever, pain , or abdominal swelling? No. Pain Score  0 *  Have you tolerated food without any problems? Yes.    Have you been able to return to your normal activities? Yes.    Do you have any questions about your discharge instructions: Diet   No. Medications  No. Follow up visit  No.  Do you have questions or concerns about your Care? No.  Actions: * If pain score is 4 or above: No action needed, pain <4.

## 2014-07-23 ENCOUNTER — Encounter: Payer: Self-pay | Admitting: Internal Medicine

## 2014-07-23 DIAGNOSIS — K2270 Barrett's esophagus without dysplasia: Secondary | ICD-10-CM | POA: Insufficient documentation

## 2014-08-06 ENCOUNTER — Encounter: Payer: Self-pay | Admitting: Internal Medicine

## 2014-08-09 ENCOUNTER — Ambulatory Visit (INDEPENDENT_AMBULATORY_CARE_PROVIDER_SITE_OTHER): Payer: No Typology Code available for payment source | Admitting: Internal Medicine

## 2014-08-09 ENCOUNTER — Encounter: Payer: Self-pay | Admitting: Internal Medicine

## 2014-08-09 VITALS — BP 112/80 | HR 75 | Temp 98.1°F | Ht 61.0 in | Wt 120.2 lb

## 2014-08-09 DIAGNOSIS — F17200 Nicotine dependence, unspecified, uncomplicated: Secondary | ICD-10-CM

## 2014-08-09 DIAGNOSIS — J3089 Other allergic rhinitis: Secondary | ICD-10-CM

## 2014-08-09 DIAGNOSIS — J302 Other seasonal allergic rhinitis: Secondary | ICD-10-CM

## 2014-08-09 DIAGNOSIS — Z720 Tobacco use: Secondary | ICD-10-CM

## 2014-08-09 DIAGNOSIS — I10 Essential (primary) hypertension: Secondary | ICD-10-CM

## 2014-08-09 MED ORDER — FEXOFENADINE HCL 60 MG PO TABS: 60.0000 mg | ORAL_TABLET | Freq: Two times a day (BID) | ORAL | Status: DC

## 2014-08-09 NOTE — Patient Instructions (Signed)
General Instructions: -Start taking allegra 60mg  twice per day, you may also take the 24hr tablet if it costs less.  -Continue working on quitting smoking.  -Try to avoid caffeine after 2PM to improve your sleep.  -Follow up in 3 months or sooner as needed.   Please bring your medicines with you each time you come.   Medicines may be  Eye drops  Herbal   Vitamins  Pills  Seeing these help [place] take care of you.    Treatment Goals:  Goals (1 Years of Data) as of 08/09/14         As of Today 07/18/14 07/18/14 07/18/14 07/18/14     Blood Pressure    . Blood Pressure < 140/90  112/80 190/96 167/98 150/117 141/94      Progress Toward Treatment Goals:  Treatment Goal 08/09/2014  Blood pressure at goal  Stop smoking smoking the same amount    Self Care Goals & Plans:  Self Care Goal 08/09/2014  Manage my medications bring my medications to every visit; refill my medications on time  Monitor my health keep track of my blood pressure  Eat healthy foods -  Be physically active find an activity I enjoy  Stop smoking call [facility] (1-800-QUIT-NOW)  Meeting treatment goals maintain the current self-care plan    No flowsheet data found.   Care Management & Community Referrals:  Referral 04/02/2014  Referrals made for care management support none needed

## 2014-08-11 DIAGNOSIS — J399 Disease of upper respiratory tract, unspecified: Secondary | ICD-10-CM | POA: Insufficient documentation

## 2014-08-11 NOTE — Assessment & Plan Note (Signed)
  Assessment: Progress toward smoking cessation:  smoking the same amount Barriers to progress toward smoking cessation:  withdrawal symptoms Comments: She was cutting back on smoking but due to recent financial stress she is back to smoking 1 pack per day. She is not ready to cut back  Plan: Instruction/counseling given:  I counseled patient on the dangers of tobacco use, advised patient to stop smoking, and reviewed strategies to maximize success. Educational resources provided:    Self management tools provided:    Medications to assist with smoking cessation:  None Patient agreed to the following self-care plans for smoking cessation: call [facility] (1-800-QUIT-NOW)  Other plans: Continue counseling

## 2014-08-11 NOTE — Assessment & Plan Note (Signed)
She has rhinorrhea, sneezing, itchy watery eyes, c/w with seasonal allergy flare-up.  Rx Allegra 60mg  BID ($9 at [employer]).

## 2014-08-11 NOTE — Progress Notes (Signed)
   Subjective:    [name], female    DOB: 1956-06-02, 58 y.o.   MRN: [id number]  HPI Brittany Burns is a 58 yr old woman with PMH of HTN, GERD, tobacco use, osteoporosis, who presents for follow up visit for her hypertension. In addition she complains of rhinorrhea and itchy watery eyes due to allergic rhinitis. She has been feeling down as well due to her job loss a couple months ago. She is living with her daughter who helps her financially.  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 She had EGD with bx negative for H. Pylori but remarkable for Barrett's esophagus.   She has been taking her PPI faithfully and has noted improvement of her stomach discomfort.    Review of Systems  Constitutional: Negative for fever, chills and appetite change.  HENT: Positive for congestion and sneezing. Negative for sinus pressure.   Eyes: Positive for itching.  Respiratory: Negative for cough and shortness of breath.   Cardiovascular: Negative for chest pain and leg swelling.  Genitourinary: Negative for dysuria.  Neurological: Negative for dizziness and light-headedness.  Psychiatric/Behavioral: Negative for suicidal ideas.       Objective:   Physical Exam  Nursing note and vitals reviewed. Constitutional: She is oriented to person, place, and time. She appears well-developed and well-nourished. No distress.  HENT:  Mouth/Throat: Oropharynx is clear and moist. No oropharyngeal exudate.  Pale nasal turbinates bilaterally with no discharge  Eyes:  Watery eyes with no thick discharge, no conjunctival redness.   Cardiovascular: Normal rate and regular rhythm.   Pulmonary/Chest: Effort normal. No respiratory distress. She has no wheezes. She has no rales.  Abdominal: Soft. There is no tenderness.  Musculoskeletal: She exhibits no edema and no tenderness.  Neurological: She is alert and  oriented to person, place, and time.  Skin: Skin is warm and dry. She is not diaphoretic.  Psychiatric: She has a normal mood and affect.          Assessment & Plan:

## 2014-08-11 NOTE — Assessment & Plan Note (Signed)
BP Readings from Last 3 Encounters:  08/09/14 112/80  07/18/14 190/96  06/14/14 100/66    Lab Results  Component Value Date   NA 141 06/26/2014   K 3.3* 06/26/2014   CREATININE 0.8 06/26/2014    Assessment: Blood pressure control: controlled Progress toward BP goal:  at goal Comments: She is on clonidine 0.2mg  BID, norvasc 5 mg daily. She reports feeling sleepy during the day but she is not sure if it is a SE of the clonidine.   Plan: Medications:  continue current medications Educational resources provided:   Self management tools provided:   Other plans: Follow up in 3 months.

## 2014-08-15 ENCOUNTER — Other Ambulatory Visit: Payer: Self-pay | Admitting: Internal Medicine

## 2014-08-15 DIAGNOSIS — E876 Hypokalemia: Secondary | ICD-10-CM

## 2014-08-15 NOTE — Progress Notes (Signed)
Internal Medicine Clinic Attending Date of visit: 08/09/2014   Case discussed with Dr. Hayes Ludwig soon after the resident saw the patient.  We reviewed the resident's history and exam and pertinent patient test results.  I agree with the assessment, diagnosis, and plan of care documented in the resident's note.

## 2014-08-20 ENCOUNTER — Other Ambulatory Visit: Payer: Self-pay

## 2014-10-23 NOTE — Addendum Note (Signed)
Addended by: Truddie Crumble on: 10/23/2014 02:31 PM   Modules accepted: Orders

## 2014-11-08 ENCOUNTER — Ambulatory Visit: Payer: Self-pay

## 2014-11-15 ENCOUNTER — Emergency Department (HOSPITAL_COMMUNITY): Payer: Self-pay

## 2014-11-15 ENCOUNTER — Encounter (HOSPITAL_COMMUNITY): Payer: Self-pay | Admitting: *Deleted

## 2014-11-15 ENCOUNTER — Emergency Department (HOSPITAL_COMMUNITY)
Admission: EM | Admit: 2014-11-15 | Discharge: 2014-11-15 | Disposition: A | Discharge status: Home / Self Care | Payer: Self-pay | Attending: Emergency Medicine | Admitting: Emergency Medicine

## 2014-11-15 DIAGNOSIS — Z8781 Personal history of (healed) traumatic fracture: Secondary | ICD-10-CM | POA: Insufficient documentation

## 2014-11-15 DIAGNOSIS — Z3202 Encounter for pregnancy test, result negative: Secondary | ICD-10-CM | POA: Insufficient documentation

## 2014-11-15 DIAGNOSIS — R1084 Generalized abdominal pain: Secondary | ICD-10-CM

## 2014-11-15 DIAGNOSIS — R1110 Vomiting, unspecified: Secondary | ICD-10-CM

## 2014-11-15 DIAGNOSIS — Z79899 Other long term (current) drug therapy: Secondary | ICD-10-CM | POA: Insufficient documentation

## 2014-11-15 DIAGNOSIS — R197 Diarrhea, unspecified: Secondary | ICD-10-CM | POA: Insufficient documentation

## 2014-11-15 DIAGNOSIS — K219 Gastro-esophageal reflux disease without esophagitis: Secondary | ICD-10-CM | POA: Insufficient documentation

## 2014-11-15 DIAGNOSIS — Z8673 Personal history of transient ischemic attack (TIA), and cerebral infarction without residual deficits: Secondary | ICD-10-CM | POA: Insufficient documentation

## 2014-11-15 DIAGNOSIS — K625 Hemorrhage of anus and rectum: Secondary | ICD-10-CM | POA: Insufficient documentation

## 2014-11-15 DIAGNOSIS — E785 Hyperlipidemia, unspecified: Secondary | ICD-10-CM | POA: Insufficient documentation

## 2014-11-15 DIAGNOSIS — R109 Unspecified abdominal pain: Secondary | ICD-10-CM

## 2014-11-15 DIAGNOSIS — Z720 Tobacco use: Secondary | ICD-10-CM | POA: Insufficient documentation

## 2014-11-15 DIAGNOSIS — R112 Nausea with vomiting, unspecified: Secondary | ICD-10-CM | POA: Insufficient documentation

## 2014-11-15 DIAGNOSIS — I10 Essential (primary) hypertension: Secondary | ICD-10-CM | POA: Insufficient documentation

## 2014-11-15 LAB — URINALYSIS, ROUTINE W REFLEX MICROSCOPIC
Bilirubin Urine: NEGATIVE
Glucose, UA: NEGATIVE mg/dL
KETONES UR: 40 mg/dL — AB
Leukocytes, UA: NEGATIVE
Nitrite: NEGATIVE
PROTEIN: 100 mg/dL — AB
Specific Gravity, Urine: 1.028 (ref 1.005–1.030)
UROBILINOGEN UA: 0.2 mg/dL (ref 0.0–1.0)
pH: 5 (ref 5.0–8.0)

## 2014-11-15 LAB — CBC WITH DIFFERENTIAL/PLATELET
BASOS ABS: 0 10*3/uL (ref 0.0–0.1)
BASOS PCT: 0 % (ref 0–1)
EOS ABS: 0 10*3/uL (ref 0.0–0.7)
EOS PCT: 0 % (ref 0–5)
HCT: 45.1 % (ref 36.0–46.0)
Hemoglobin: 15.9 g/dL — ABNORMAL HIGH (ref 12.0–15.0)
Lymphocytes Relative: 19 % (ref 12–46)
Lymphs Abs: 2 10*3/uL (ref 0.7–4.0)
MCH: 32.7 pg (ref 26.0–34.0)
MCHC: 35.3 g/dL (ref 30.0–36.0)
MCV: 92.8 fL (ref 78.0–100.0)
Monocytes Absolute: 0.6 10*3/uL (ref 0.1–1.0)
Monocytes Relative: 5 % (ref 3–12)
NEUTROS PCT: 76 % (ref 43–77)
Neutro Abs: 7.9 10*3/uL — ABNORMAL HIGH (ref 1.7–7.7)
PLATELETS: 293 10*3/uL (ref 150–400)
RBC: 4.86 MIL/uL (ref 3.87–5.11)
RDW: 13.9 % (ref 11.5–15.5)
WBC: 10.5 10*3/uL (ref 4.0–10.5)

## 2014-11-15 LAB — COMPREHENSIVE METABOLIC PANEL
ALT: 19 U/L (ref 0–35)
ANION GAP: 18 — AB (ref 5–15)
AST: 16 U/L (ref 0–37)
Albumin: 4.6 g/dL (ref 3.5–5.2)
Alkaline Phosphatase: 100 U/L (ref 39–117)
BILIRUBIN TOTAL: 0.6 mg/dL (ref 0.3–1.2)
BUN: 17 mg/dL (ref 6–23)
CO2: 22 mEq/L (ref 19–32)
Calcium: 10 mg/dL (ref 8.4–10.5)
Chloride: 102 mEq/L (ref 96–112)
Creatinine, Ser: 0.58 mg/dL (ref 0.50–1.10)
GFR calc Af Amer: >90 mL/min (ref >90)
GFR calc non Af Amer: >90 mL/min (ref >90)
GLUCOSE: 120 mg/dL — AB (ref 70–99)
Potassium: 3.4 mEq/L — ABNORMAL LOW (ref 3.7–5.3)
Sodium: 142 mEq/L (ref 137–147)
TOTAL PROTEIN: 8.2 g/dL (ref 6.0–8.3)

## 2014-11-15 LAB — LIPASE, BLOOD: Lipase: 22 U/L (ref 11–59)

## 2014-11-15 LAB — URINE MICROSCOPIC-ADD ON

## 2014-11-15 LAB — POC OCCULT BLOOD, ED: FECAL OCCULT BLD: POSITIVE — AB

## 2014-11-15 LAB — PREGNANCY, URINE: Preg Test, Ur: NEGATIVE

## 2014-11-15 MED ORDER — IOHEXOL 300 MG/ML  SOLN
100.0000 mL | Freq: Once | INTRAMUSCULAR | Status: AC | PRN
  Administered 2014-11-15: 100 mL via INTRAVENOUS

## 2014-11-15 MED ORDER — ONDANSETRON 4 MG PO TBDP: 4.0000 mg | ORAL_TABLET | Freq: Three times a day (TID) | ORAL | Status: DC | PRN

## 2014-11-15 MED ORDER — ONDANSETRON HCL 4 MG/2ML IJ SOLN
4.0000 mg | Freq: Once | INTRAMUSCULAR | Status: AC
  Administered 2014-11-15: 4 mg via INTRAVENOUS
  Filled 2014-11-15: qty 2

## 2014-11-15 MED ORDER — PANTOPRAZOLE SODIUM 40 MG IV SOLR
40.0000 mg | Freq: Once | INTRAVENOUS | Status: AC
  Administered 2014-11-15: 40 mg via INTRAVENOUS
  Filled 2014-11-15: qty 40

## 2014-11-15 MED ORDER — IOHEXOL 300 MG/ML  SOLN
25.0000 mL | Freq: Once | INTRAMUSCULAR | Status: AC | PRN
  Administered 2014-11-15: 25 mL via ORAL

## 2014-11-15 MED ORDER — HYDROMORPHONE HCL 1 MG/ML IJ SOLN
1.0000 mg | Freq: Once | INTRAMUSCULAR | Status: AC
  Administered 2014-11-15: 1 mg via INTRAVENOUS
  Filled 2014-11-15: qty 1

## 2014-11-15 MED ORDER — SODIUM CHLORIDE 0.9 % IV SOLN
INTRAVENOUS | Status: DC
  Administered 2014-11-15: 100 mL/h via INTRAVENOUS

## 2014-11-15 MED ORDER — SODIUM CHLORIDE 0.9 % IV BOLUS (SEPSIS)
250.0000 mL | Freq: Once | INTRAVENOUS | Status: AC
  Administered 2014-11-15: 250 mL via INTRAVENOUS

## 2014-11-15 NOTE — Discharge Instructions (Signed)
CT of the abdomen negative. No evidence of significant bleeding from the rectum. Which her stool was heme positive. Important to follow-up with your regular Dr. for recheck in the next few days. May also need colonoscopy done. Return for any pure blood in your bowel movements or for any new or worse symptoms. Take Zofran as needed for any nausea.

## 2014-11-15 NOTE — Progress Notes (Signed)
[facility]  Patient is established with care at [facility]. Patient states she is in the process of renewing her orange card, and declined a new application. Patient states she is missing some documents that she needs to return to [facility] to complete the orange card process. Patient states she has an upcoming appointment in January with her pcp. My contact information provided for any future questions or concerns. No other needs identified at this time.

## 2014-11-15 NOTE — ED Notes (Signed)
Pt comfortable with discharge and follow up instructions. Pt declines wheelchair, escorted to waiting area by this RN. Prescriptions x1. 

## 2014-11-15 NOTE — ED Notes (Signed)
Pt states emesis and dark stools since yesterday.  Also states burning with urination since arriving in ED.  Denies any chest or abdominal pain.

## 2014-11-15 NOTE — ED Notes (Signed)
Pt returned from CT °
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
## 2014-11-15 NOTE — ED Notes (Signed)
Patient transported to CT 

## 2014-11-15 NOTE — ED Notes (Signed)
Pt experienced vomitting before medication given.

## 2014-11-15 NOTE — ED Notes (Signed)
Pt dressed and refused to get hooked back to vital machine.

## 2014-11-15 NOTE — ED Notes (Signed)
Pt finished contrast, CT called.

## 2014-11-15 NOTE — ED Notes (Signed)
Pt wanted to be taken off fluids due to feeling better and wanting to go home.  RN told pt that MD had to see all results before discharging.

## 2014-11-15 NOTE — ED Notes (Signed)
Dr. Rogene Houston notified about bp.

## 2014-11-19 ENCOUNTER — Ambulatory Visit: Payer: Self-pay

## 2014-12-10 ENCOUNTER — Encounter: Payer: Self-pay | Admitting: Internal Medicine

## 2014-12-31 ENCOUNTER — Encounter: Payer: Self-pay | Admitting: Internal Medicine

## 2015-01-22 ENCOUNTER — Telehealth: Payer: Self-pay | Admitting: Internal Medicine

## 2015-01-22 NOTE — Telephone Encounter (Signed)
Call to patient to confirm appointment for 01/23/15 at 1:15 lmtcb

## 2015-01-23 ENCOUNTER — Ambulatory Visit (INDEPENDENT_AMBULATORY_CARE_PROVIDER_SITE_OTHER): Payer: Self-pay | Admitting: Internal Medicine

## 2015-01-23 ENCOUNTER — Ambulatory Visit (HOSPITAL_COMMUNITY)
Admission: RE | Admit: 2015-01-23 | Discharge: 2015-01-23 | Disposition: A | Discharge status: Home / Self Care | Payer: Self-pay | Source: Ambulatory Visit | Attending: Internal Medicine | Admitting: Internal Medicine

## 2015-01-23 VITALS — BP 158/84 | HR 70 | Temp 97.6°F | Resp 20 | Ht 61.0 in | Wt 117.6 lb

## 2015-01-23 DIAGNOSIS — S2232XS Fracture of one rib, left side, sequela: Secondary | ICD-10-CM

## 2015-01-23 DIAGNOSIS — M25561 Pain in right knee: Secondary | ICD-10-CM

## 2015-01-23 DIAGNOSIS — F17200 Nicotine dependence, unspecified, uncomplicated: Secondary | ICD-10-CM

## 2015-01-23 DIAGNOSIS — Z720 Tobacco use: Secondary | ICD-10-CM

## 2015-01-23 DIAGNOSIS — X58XXXS Exposure to other specified factors, sequela: Secondary | ICD-10-CM

## 2015-01-23 DIAGNOSIS — M25562 Pain in left knee: Secondary | ICD-10-CM

## 2015-01-23 DIAGNOSIS — K047 Periapical abscess without sinus: Secondary | ICD-10-CM | POA: Insufficient documentation

## 2015-01-23 DIAGNOSIS — G5603 Carpal tunnel syndrome, bilateral upper limbs: Secondary | ICD-10-CM

## 2015-01-23 DIAGNOSIS — G5602 Carpal tunnel syndrome, left upper limb: Secondary | ICD-10-CM

## 2015-01-23 DIAGNOSIS — R0781 Pleurodynia: Secondary | ICD-10-CM

## 2015-01-23 DIAGNOSIS — G5601 Carpal tunnel syndrome, right upper limb: Secondary | ICD-10-CM

## 2015-01-23 DIAGNOSIS — I10 Essential (primary) hypertension: Secondary | ICD-10-CM

## 2015-01-23 DIAGNOSIS — Z0000 Encounter for general adult medical examination without abnormal findings: Secondary | ICD-10-CM

## 2015-01-23 MED ORDER — ACETAMINOPHEN 500 MG PO TABS: 500.0000 mg | ORAL_TABLET | Freq: Four times a day (QID) | ORAL | Status: DC | PRN

## 2015-01-23 MED ORDER — AMOXICILLIN-POT CLAVULANATE 875-125 MG PO TABS: 1.0000 | ORAL_TABLET | Freq: Two times a day (BID) | ORAL | Status: DC

## 2015-01-23 NOTE — Patient Instructions (Addendum)
General Instructions: -Start taking Tylenol 500mg  1-2 tablet every 6 hours. You may take up to 8 tablets in one day.  -You have been referred to Orthopedic Surgery for evaluation of carpal tunnel and your knee pain.  -You have been referred to the Dentist. They will call you with the appointment day and time.  -You have been prescribed Augmentin for a tooth abscess. Take 1 tablet twice per day for 5 days.  -Continue taking your blood pressure medications amlodipine and clonidine.  -Follow up with [place] in 6 months or sooner as needed.    Please bring your medicines with you each time you come to clinic.  Medicines may include prescription medications, over-the-counter medications, herbal remedies, eye drops, vitamins, or other pills.   Progress Toward Treatment Goals:  Treatment Goal 08/09/2014  Blood pressure at goal  Stop smoking smoking the same amount    Self Care Goals & Plans:  Self Care Goal 01/23/2015  Manage my medications take my medicines as prescribed; bring my medications to every visit; refill my medications on time  Monitor my health keep track of my blood pressure; bring my blood pressure log to each visit  Eat healthy foods eat foods that are low in salt  Be physically active find an activity I enjoy  Stop smoking cut down the number of cigarettes smoked  Meeting treatment goals -    No flowsheet data found.   Care Management & Community Referrals:  Referral 04/02/2014  Referrals made for care management support none needed

## 2015-01-23 NOTE — Progress Notes (Signed)
   Subjective:    [name], female    DOB: [birth year], 59 y.o.   MRN: [id number]  HPI [name] is a 59 yr old woman with PMH of HTN, GERD, tobacco use, osteoporosis, who presents for routine follow up fir her hypertension.   She had an ED visit on 11/15/14 for what appeared to be gastroenteritis with abdominal cramping, N/V/D and bloody stools with stable Hgb. She denies melena or hematochezia since then. 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 She had EGD with bx negative for H. Pylori but remarkable for Barrett's esophagus.   She wants dental referral for loose teeth and an abscess. She reports that she had drainage from her right upper gum the night prior to this visit and is very tender in that area.   She has been seen at Sports Medicine, had bilateral Knee Xrays for her chronic OA pain ordered but has not had them yet. She also has carpal tunnel syndrome that is worse in her right hand, she feels like her right hand is getting weaker. It is difficulty for her to wear wrist splints for work as she constantly has to remove them to wash her hands.  She is very stressed about her living arrangements as she does not like to live with her daughter. She is in the process of applying for disability.     Review of Systems  Constitutional: Negative for fever, chills, activity change, appetite change, fatigue and unexpected weight change.  Respiratory: Negative for cough, shortness of breath and wheezing.   Cardiovascular: Negative for leg swelling.  Gastrointestinal: Negative for abdominal pain and blood in stool.  Genitourinary: Negative for dysuria and difficulty urinating.  Neurological: Negative for dizziness and light-headedness.  Psychiatric/Behavioral: Negative for agitation.       Objective:   Physical Exam  Constitutional: She is oriented to  person, place, and time. She appears well-developed and well-nourished.  HENT:  Red gum, right upper gum with focus of erythema, exquisitely tender, no purulent discharge   Cardiovascular: Normal rate.   Pulmonary/Chest: Effort normal. No respiratory distress.  Abdominal: Soft.  Musculoskeletal: She exhibits no edema.  Bilateral knees with no joint deformity, no edema, no TTP, limited ROM due to pain on the medial and lateral aspect, minor crepitus.  Hand grip 3/5 in right hand with mild thenar wasting and weaker finger adduction.  Hand grip 4/5 in the left hand with no noticeable muscle wasting.   Neurological: She is alert and oriented to person, place, and time.  Skin: Skin is warm and dry. No rash noted. She is not diaphoretic. No erythema.  Psychiatric:  Tearful at times.   Nursing note and vitals reviewed.         Assessment & Plan:

## 2015-01-23 NOTE — Assessment & Plan Note (Addendum)
BP Readings from Last 3 Encounters:  01/23/15 158/84  11/15/14 161/98  08/09/14 112/80    Lab Results  Component Value Date   NA 142 11/15/2014   K 3.4* 11/15/2014   CREATININE 0.58 11/15/2014    Assessment: Blood pressure control:  not controlled Progress toward BP goal:   not at goal Comments: She is on clonidine 0.2mg  BID, amlodipine 5mg  daily. She did not take amlodipine the day of this visit.   Plan: Medications:  continue current medications Educational resources provided: brochure Self management tools provided: home blood pressure logbook Other plans: She was advised to take her amlodipine and clonidine every day and recheck her BP at the Pharmacy. Follow up in 3-6 months.

## 2015-01-23 NOTE — Assessment & Plan Note (Signed)
  Assessment: Progress toward smoking cessation:    Barriers to progress toward smoking cessation:    Comments: smoking less  Plan: Instruction/counseling given:  I counseled patient on the dangers of tobacco use, advised patient to stop smoking, and reviewed strategies to maximize success. Educational resources provided:    Self management tools provided:    Medications to assist with smoking cessation:  None Patient agreed to the following self-care plans for smoking cessation:    Other plans: Will continue providing counseling.

## 2015-01-24 ENCOUNTER — Ambulatory Visit (HOSPITAL_COMMUNITY)
Admission: RE | Admit: 2015-01-24 | Discharge: 2015-01-24 | Disposition: A | Discharge status: Home / Self Care | Payer: Self-pay | Source: Ambulatory Visit | Attending: Internal Medicine | Admitting: Internal Medicine

## 2015-01-24 ENCOUNTER — Encounter: Payer: Self-pay | Admitting: Internal Medicine

## 2015-01-24 ENCOUNTER — Ambulatory Visit (HOSPITAL_COMMUNITY): Admission: RE | Admit: 2015-01-24 | Payer: Self-pay | Source: Ambulatory Visit

## 2015-01-24 DIAGNOSIS — M25562 Pain in left knee: Secondary | ICD-10-CM | POA: Insufficient documentation

## 2015-01-24 DIAGNOSIS — M25561 Pain in right knee: Secondary | ICD-10-CM | POA: Insufficient documentation

## 2015-01-24 NOTE — Assessment & Plan Note (Signed)
She has a right upper gum area of erythema that is exquisitely  tender, she reports that it had purulent discharge the night prior to this visit. She wears partial frontal dentures but reports some lose teeth.  -Rx augmenting 875 BID x5 days with close follow up as needed -Referred to dentistry

## 2015-01-24 NOTE — Addendum Note (Signed)
Addended by: Adele Barthel D on: 01/24/2015 10:04 AM   Modules accepted: Orders

## 2015-01-24 NOTE — Assessment & Plan Note (Addendum)
No edema, erythema, fever/chills, no indication of infections process at this time. She has known right knee degenerative disease, was seen at Sports Medicine for this. Her left knee is also hurting now.  -Ordered bilateral Xrays, referred to Orthopedic Surgery for further evaluation and pain management (injections, PT referral?) -Tylenol 500mg  1-2 tablet q6hr for pain (avoid NSAIDs in setting of HTN)

## 2015-01-24 NOTE — Assessment & Plan Note (Signed)
She continues to have TTP over her left rib. She requests a back brace for this but she was advised not to wear a chest binder as this may predispose her to atelectasis and pneumonia.  She will try to find an OTC back brace.

## 2015-01-24 NOTE — Assessment & Plan Note (Signed)
Wearing wrist splints at work is difficult for her as she has to remove them constantly to wash her hands--she works in [employer].  She has evidence of muscle wasting and weak right hand grip and therefore needs surgical evaluation.  -Referred to Orthopedic Surgery

## 2015-01-24 NOTE — Assessment & Plan Note (Signed)
She declines the flu vaccine

## 2015-01-25 NOTE — Progress Notes (Signed)
INTERNAL MEDICINE TEACHING ATTENDING ADDENDUM - Damari Suastegui, MD: I reviewed and discussed at the time of visit with the resident Dr. Kennerly, the patient's medical history, physical examination, diagnosis and results of pertinent tests and treatment and I agree with the patient's care as documented.  

## 2015-01-29 ENCOUNTER — Telehealth: Payer: Self-pay | Admitting: *Deleted

## 2015-01-29 NOTE — Telephone Encounter (Signed)
Pt called requesting results of her knee xrays. Pt # [id number]

## 2015-01-29 NOTE — Telephone Encounter (Signed)
Called patient to discuss findings of her Xray which indicate no joint space disease with possible tiny effusion of right knee. She tells me she bumped her left knee slightly today but does not have pain in her right knee. She will apply ice to her left knee and take Tylenol as needed.  She was instructed to call us and make a follow up appointment if she had knee pain again. She agreed with this plan and thanked me for the call.

## 2015-02-22 ENCOUNTER — Other Ambulatory Visit: Payer: Self-pay | Admitting: *Deleted

## 2015-02-22 DIAGNOSIS — I10 Essential (primary) hypertension: Secondary | ICD-10-CM

## 2015-02-24 MED ORDER — CLONIDINE HCL 0.2 MG PO TABS: 0.2000 mg | ORAL_TABLET | Freq: Two times a day (BID) | ORAL | Status: DC

## 2015-02-26 NOTE — Telephone Encounter (Signed)
Faxed in

## 2015-03-06 ENCOUNTER — Telehealth: Payer: Self-pay | Admitting: *Deleted

## 2015-03-06 NOTE — Telephone Encounter (Signed)
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 They need to be aware of her disabilities. Could you provide her with a letter or note explaining all these problems

## 2015-03-07 ENCOUNTER — Encounter: Payer: Self-pay | Admitting: Internal Medicine

## 2015-03-07 NOTE — Telephone Encounter (Signed)
I will be able to write a letter for her tomorrow morning (4/1).  Thanks,  SK

## 2015-03-07 NOTE — Telephone Encounter (Signed)
Brittany Burns, I left a letter at your desk. Let me know if the patient is ok with it.  Thanks , SK

## 2015-03-08 NOTE — Telephone Encounter (Signed)
Dr Hayes Ludwig the pt wants you to states also that she is not able to pick up anything

## 2015-03-12 ENCOUNTER — Telehealth: Payer: Self-pay | Admitting: Internal Medicine

## 2015-03-12 NOTE — Telephone Encounter (Signed)
Called Brittany Burns she states that she is not able to come to the clinic to pick up the letter I wrote her on 03/06/15.  She is aware that she needs to wear her wrist splints in both wrists.  I informed her that she has been referred to orthopedic surgery for her bilateral carpal tunnel pain but that it might be 30 days or so until she can have an appointment made (I talked to [facility] who confirmed that [facility] does not have an appointment for the patient yet).  Brittany Burns appreciated the call and requested that the letter be mailed to her.   -Appreciate Helen's help mailing the letter to Brittany Burns.   Blain Pais, MD 03/12/15 3:28 PM

## 2015-03-26 ENCOUNTER — Telehealth: Payer: Self-pay | Admitting: Internal Medicine

## 2015-03-26 NOTE — Telephone Encounter (Signed)
Rec'd phone call from patient in regards to a letter being mailed 03/06/2015.  Patient had not rec'd letter as of today.   Reprinted Letter from the chart and confirmed with Nurse Ulis Rias to send Letter to patient.

## 2015-04-19 ENCOUNTER — Encounter: Payer: Self-pay | Admitting: *Deleted

## 2015-04-26 ENCOUNTER — Other Ambulatory Visit: Payer: Self-pay | Admitting: *Deleted

## 2015-04-26 MED ORDER — OMEPRAZOLE 40 MG PO CPDR: DELAYED_RELEASE_CAPSULE | ORAL | Status: DC

## 2015-04-29 NOTE — Telephone Encounter (Signed)
Called to pharm 

## 2015-05-20 ENCOUNTER — Other Ambulatory Visit: Payer: Self-pay | Admitting: *Deleted

## 2015-05-20 DIAGNOSIS — E785 Hyperlipidemia, unspecified: Secondary | ICD-10-CM

## 2015-05-20 MED ORDER — SIMVASTATIN 20 MG PO TABS: 20.0000 mg | ORAL_TABLET | Freq: Every evening | ORAL | Status: DC

## 2015-05-20 MED ORDER — AMLODIPINE BESYLATE 5 MG PO TABS: 5.0000 mg | ORAL_TABLET | Freq: Every day | ORAL | Status: DC

## 2015-05-20 NOTE — Telephone Encounter (Signed)
Called to [facility]

## 2015-06-17 ENCOUNTER — Emergency Department (HOSPITAL_COMMUNITY)
Admission: EM | Admit: 2015-06-17 | Discharge: 2015-06-18 | Disposition: A | Discharge status: Home / Self Care | Payer: Medicaid Other | Attending: Emergency Medicine | Admitting: Emergency Medicine

## 2015-06-17 ENCOUNTER — Encounter (HOSPITAL_COMMUNITY): Payer: Self-pay

## 2015-06-17 DIAGNOSIS — E785 Hyperlipidemia, unspecified: Secondary | ICD-10-CM | POA: Diagnosis not present

## 2015-06-17 DIAGNOSIS — Z720 Tobacco use: Secondary | ICD-10-CM | POA: Diagnosis not present

## 2015-06-17 DIAGNOSIS — K219 Gastro-esophageal reflux disease without esophagitis: Secondary | ICD-10-CM | POA: Diagnosis not present

## 2015-06-17 DIAGNOSIS — R112 Nausea with vomiting, unspecified: Secondary | ICD-10-CM | POA: Diagnosis present

## 2015-06-17 DIAGNOSIS — Z8781 Personal history of (healed) traumatic fracture: Secondary | ICD-10-CM | POA: Diagnosis not present

## 2015-06-17 DIAGNOSIS — Z79899 Other long term (current) drug therapy: Secondary | ICD-10-CM | POA: Insufficient documentation

## 2015-06-17 DIAGNOSIS — I252 Old myocardial infarction: Secondary | ICD-10-CM | POA: Diagnosis not present

## 2015-06-17 DIAGNOSIS — Z792 Long term (current) use of antibiotics: Secondary | ICD-10-CM | POA: Diagnosis not present

## 2015-06-17 DIAGNOSIS — K644 Residual hemorrhoidal skin tags: Secondary | ICD-10-CM | POA: Insufficient documentation

## 2015-06-17 DIAGNOSIS — I10 Essential (primary) hypertension: Secondary | ICD-10-CM | POA: Diagnosis not present

## 2015-06-17 DIAGNOSIS — K2970 Gastritis, unspecified, without bleeding: Secondary | ICD-10-CM | POA: Diagnosis not present

## 2015-06-17 LAB — URINE MICROSCOPIC-ADD ON

## 2015-06-17 LAB — URINALYSIS, ROUTINE W REFLEX MICROSCOPIC
GLUCOSE, UA: NEGATIVE mg/dL
KETONES UR: 40 mg/dL — AB
LEUKOCYTES UA: NEGATIVE
NITRITE: NEGATIVE
Protein, ur: >300 mg/dL — AB
SPECIFIC GRAVITY, URINE: 1.029 (ref 1.005–1.030)
Urobilinogen, UA: 0.2 mg/dL (ref 0.0–1.0)
pH: 5 (ref 5.0–8.0)

## 2015-06-17 MED ORDER — ONDANSETRON HCL 4 MG/2ML IJ SOLN
4.0000 mg | Freq: Once | INTRAMUSCULAR | Status: AC
  Administered 2015-06-18: 4 mg via INTRAVENOUS
  Filled 2015-06-17: qty 2

## 2015-06-17 MED ORDER — SODIUM CHLORIDE 0.9 % IV BOLUS (SEPSIS)
1000.0000 mL | Freq: Once | INTRAVENOUS | Status: AC
Start: 2015-06-17 — End: 2015-06-18
  Administered 2015-06-18: 1000 mL via INTRAVENOUS

## 2015-06-17 MED ORDER — SODIUM CHLORIDE 0.9 % IV BOLUS (SEPSIS)
1000.0000 mL | Freq: Once | INTRAVENOUS | Status: AC
  Administered 2015-06-18: 1000 mL via INTRAVENOUS

## 2015-06-17 MED ORDER — FAMOTIDINE IN NACL 20-0.9 MG/50ML-% IV SOLN
20.0000 mg | Freq: Once | INTRAVENOUS | Status: AC
  Administered 2015-06-18: 20 mg via INTRAVENOUS
  Filled 2015-06-17: qty 50

## 2015-06-17 NOTE — ED Notes (Signed)
PA at bedside.

## 2015-06-17 NOTE — ED Notes (Signed)
Pt comes in via EMS, c/o n/v since this morning at 0300 with some self reported "coffee ground emesis."  Pt denies diarrhea, complains that she feels she needs to have a bowel movement but nothing comes out but gas.  Pt ambulatory on scene, VSS en route with EMS.

## 2015-06-17 NOTE — ED Notes (Signed)
RN attempt to start IV with no success; 2nd RN to start IV and obtain blood
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
## 2015-06-18 LAB — COMPREHENSIVE METABOLIC PANEL
ALT: 36 U/L (ref 14–54)
AST: 25 U/L (ref 15–41)
Albumin: 4.6 g/dL (ref 3.5–5.0)
Alkaline Phosphatase: 106 U/L (ref 38–126)
Anion gap: 13 (ref 5–15)
BILIRUBIN TOTAL: 0.7 mg/dL (ref 0.3–1.2)
BUN: 14 mg/dL (ref 6–20)
CHLORIDE: 104 mmol/L (ref 101–111)
CO2: 23 mmol/L (ref 22–32)
CREATININE: 0.77 mg/dL (ref 0.44–1.00)
Calcium: 9.7 mg/dL (ref 8.9–10.3)
GFR calc Af Amer: >60 mL/min (ref >60)
GLUCOSE: 106 mg/dL — AB (ref 65–99)
Potassium: 3.4 mmol/L — ABNORMAL LOW (ref 3.5–5.1)
Sodium: 140 mmol/L (ref 135–145)
Total Protein: 8.3 g/dL — ABNORMAL HIGH (ref 6.5–8.1)

## 2015-06-18 LAB — CBC WITH DIFFERENTIAL/PLATELET
BASOS PCT: 0 % (ref 0–1)
Basophils Absolute: 0 10*3/uL (ref 0.0–0.1)
EOS PCT: 0 % (ref 0–5)
Eosinophils Absolute: 0 10*3/uL (ref 0.0–0.7)
HCT: 45.8 % (ref 36.0–46.0)
HEMOGLOBIN: 16.2 g/dL — AB (ref 12.0–15.0)
LYMPHS ABS: 2.1 10*3/uL (ref 0.7–4.0)
LYMPHS PCT: 25 % (ref 12–46)
MCH: 32 pg (ref 26.0–34.0)
MCHC: 35.4 g/dL (ref 30.0–36.0)
MCV: 90.5 fL (ref 78.0–100.0)
Monocytes Absolute: 0.4 10*3/uL (ref 0.1–1.0)
Monocytes Relative: 5 % (ref 3–12)
NEUTROS PCT: 70 % (ref 43–77)
Neutro Abs: 5.7 10*3/uL (ref 1.7–7.7)
Platelets: 253 10*3/uL (ref 150–400)
RBC: 5.06 MIL/uL (ref 3.87–5.11)
RDW: 13.7 % (ref 11.5–15.5)
WBC: 8.2 10*3/uL (ref 4.0–10.5)

## 2015-06-18 LAB — LIPASE, BLOOD: Lipase: 16 U/L — ABNORMAL LOW (ref 22–51)

## 2015-06-18 MED ORDER — PROMETHAZINE HCL 25 MG/ML IJ SOLN
25.0000 mg | Freq: Once | INTRAMUSCULAR | Status: AC
  Administered 2015-06-18: 25 mg via INTRAVENOUS
  Filled 2015-06-18: qty 1

## 2015-06-18 MED ORDER — ONDANSETRON 4 MG PO TBDP: 4.0000 mg | ORAL_TABLET | Freq: Three times a day (TID) | ORAL | Status: DC | PRN

## 2015-06-18 MED ORDER — FAMOTIDINE 20 MG PO TABS: 20.0000 mg | ORAL_TABLET | Freq: Two times a day (BID) | ORAL | Status: DC

## 2015-06-18 MED ORDER — SUCRALFATE 1 G PO TABS: 1.0000 g | ORAL_TABLET | Freq: Three times a day (TID) | ORAL | Status: DC

## 2015-06-18 NOTE — Discharge Instructions (Signed)
Please read and follow all provided instructions.  Your diagnoses today include:  1. Gastritis     Tests performed today include:  Blood counts and electrolytes  Blood tests to check liver and kidney function  Blood tests to check pancreas function  Urine test to look for infection  Vital signs. See below for your results today.   Medications prescribed:   Carafate - for stomach upset and to protect your stomach   Pepcid (famotidine) - antihistamine  You can find this medication over-the-counter.   DO NOT exceed:   20mg  Pepcid every 12 hours  Take any prescribed medications only as directed.  Home care instructions:   Follow any educational materials contained in this packet.   Keep drinking plenty of fluids and use the medicine for nausea as directed.    Drink clear liquids for the next 24 hours and introduce solid foods slowly after 24 hours using the b.r.a.t. diet (Bananas, Rice, Applesauce, Toast, Yogurt).    Follow-up instructions: Please follow-up with your primary care provider in the next 3 days for further evaluation of your symptoms. If you are not feeling better in 48 hours you may have a condition that is more serious and you need re-evaluation.   Return instructions:  SEEK IMMEDIATE MEDICAL ATTENTION IF:  If you have pain that does not go away or becomes severe   A temperature above 101F develops   Repeated vomiting occurs (multiple episodes)   If you have pain that becomes localized to portions of the abdomen. The right side could possibly be appendicitis. In an adult, the left lower portion of the abdomen could be colitis or diverticulitis.   Blood is being passed in stools or vomit (bright red or black tarry stools)   You develop chest pain, difficulty breathing, dizziness or fainting, or become confused, poorly responsive, or inconsolable (young children)  If you have any other emergent concerns regarding your health  Additional  Information: Abdominal (belly) pain can be caused by many things. Your caregiver performed an examination and possibly ordered blood/urine tests and imaging (CT scan, x-rays, ultrasound). Many cases can be observed and treated at home after initial evaluation in the emergency department. Even though you are being discharged home, abdominal pain can be unpredictable. Therefore, you need a repeated exam if your pain does not resolve, returns, or worsens. Most patients with abdominal pain don't have to be admitted to the hospital or have surgery, but serious problems like appendicitis and gallbladder attacks can start out as nonspecific pain. Many abdominal conditions cannot be diagnosed in one visit, so follow-up evaluations are very important.  Your vital signs today were: BP 183/74 mmHg   Pulse 77   Temp(Src) 98.3 F (36.8 C) (Oral)   Resp 24   Ht 5\' 2"  (1.575 m)   Wt 125 lb (56.7 kg)   BMI 22.86 kg/m2   SpO2 98% If your blood pressure (bp) was elevated above 135/85 this visit, please have this repeated by your doctor within one month. --------------

## 2015-06-18 NOTE — ED Notes (Signed)
Pt given water per her request for fluid challenge. She took one sip of the water and stated that when it "hit" her stomach it made her feel like she was going to throw up. She refused to drink anymore.

## 2015-07-08 ENCOUNTER — Ambulatory Visit (INDEPENDENT_AMBULATORY_CARE_PROVIDER_SITE_OTHER): Payer: Medicaid Other | Admitting: Internal Medicine

## 2015-07-08 ENCOUNTER — Encounter: Payer: Self-pay | Admitting: Internal Medicine

## 2015-07-08 VITALS — BP 119/80 | HR 88 | Temp 98.1°F | Ht 61.0 in | Wt 116.3 lb

## 2015-07-08 DIAGNOSIS — J309 Allergic rhinitis, unspecified: Secondary | ICD-10-CM | POA: Diagnosis not present

## 2015-07-08 DIAGNOSIS — K921 Melena: Secondary | ICD-10-CM

## 2015-07-08 DIAGNOSIS — F17210 Nicotine dependence, cigarettes, uncomplicated: Secondary | ICD-10-CM

## 2015-07-08 DIAGNOSIS — J302 Other seasonal allergic rhinitis: Secondary | ICD-10-CM

## 2015-07-08 DIAGNOSIS — I10 Essential (primary) hypertension: Secondary | ICD-10-CM | POA: Diagnosis not present

## 2015-07-08 DIAGNOSIS — D126 Benign neoplasm of colon, unspecified: Secondary | ICD-10-CM

## 2015-07-08 DIAGNOSIS — K2270 Barrett's esophagus without dysplasia: Secondary | ICD-10-CM | POA: Diagnosis not present

## 2015-07-08 DIAGNOSIS — Z720 Tobacco use: Secondary | ICD-10-CM

## 2015-07-08 NOTE — Assessment & Plan Note (Signed)
History of polyps and will need repeat colonoscopy in 3 years.  Pt seemed unaware of this. -f/u with Dr. Raquel James in 3 years -will have Mamie send her the f/u letter from Dr. Raquel James

## 2015-07-08 NOTE — Assessment & Plan Note (Addendum)
Pt came to ED on 7/11 with N/V after drinking beer and a mixed drink.  She was discharged with carafate, pepcid, and zofran.  Was not able to tolerate the carafate and has not taken this.  She is no longer taking zofran or pepcid.  She reports compliance with omeprazole bid.  Has a h/o Barrett's esophagus and needs to f/u with Dr. Hilarie Fredrickson. -cont current meds -advised to refrain from drinking alcohol -advised smoking cessation

## 2015-07-08 NOTE — Assessment & Plan Note (Signed)
Pt c/o watery eyes. -refilled allegra

## 2015-07-08 NOTE — Assessment & Plan Note (Signed)
Well controlled. -cont current meds

## 2015-07-08 NOTE — Assessment & Plan Note (Signed)
Pt still smoking and trying to quit. -advised trying quitline but states she can do it on her own

## 2015-07-08 NOTE — Assessment & Plan Note (Addendum)
Pt reports some bright red blood associated with BM occasionally when she is straining.  Reports constipation although eating fruits and vegetables.  Likely result of medications including clonidine and amlodipine.  Has not taken any stool softener or laxative.  Reports drinking plenty of water.  Had colonscopy last year with polypectomy (path showed tubulovillous adenoma) and recommended repeat colonoscopy in 3 years.  -will try miralax -cont to drink plenty of fluids and fruits/vegetables  -Mamie will provide letter of f/u with Dr. Hilarie Fredrickson -
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
## 2015-07-08 NOTE — Patient Instructions (Signed)
Thank you for your visit today.   Please return to the internal medicine clinic in 4-5 month(s) or sooner if needed.     You may try miralax for constipation which you may purchase over the counter.  You may take this daily.    Please be sure to bring all of your medications with you to every visit; this includes herbal supplements, vitamins, eye drops, and any over-the-counter medications.   Should you have any questions regarding your medications and/or any new or worsening symptoms, please be sure to call the clinic at [phone].   If you believe that you are suffering from a life threatening condition or one that may result in the loss of limb or function, then you should call 911 or proceed to the nearest Emergency Department.   Constipation Constipation is when a person has fewer than three bowel movements a week, has difficulty having a bowel movement, or has stools that are dry, hard, or larger than normal. As people grow older, constipation is more common. If you try to fix constipation with medicines that make you have a bowel movement (laxatives), the problem may get worse. Long-term laxative use may cause the muscles of the colon to become weak. A low-fiber diet, not taking in enough fluids, and taking certain medicines may make constipation worse.  CAUSES   Certain medicines, such as antidepressants, pain medicine, iron supplements, antacids, and water pills.   Certain diseases, such as diabetes, irritable bowel syndrome (IBS), thyroid disease, or depression.   Not drinking enough water.   Not eating enough fiber-rich foods.   Stress or travel.   Lack of physical activity or exercise.   Ignoring the urge to have a bowel movement.   Using laxatives too much.  SIGNS AND SYMPTOMS   Having fewer than three bowel movements a week.   Straining to have a bowel movement.   Having stools that are hard, dry, or larger than normal.   Feeling full or bloated.    Pain in the lower abdomen.   Not feeling relief after having a bowel movement.  DIAGNOSIS  Your health care provider will take a medical history and perform a physical exam. Further testing may be done for severe constipation. Some tests may include:  A barium enema X-ray to examine your rectum, colon, and, sometimes, your small intestine.   A sigmoidoscopy to examine your lower colon.   A colonoscopy to examine your entire colon. TREATMENT  Treatment will depend on the severity of your constipation and what is causing it. Some dietary treatments include drinking more fluids and eating more fiber-rich foods. Lifestyle treatments may include regular exercise. If these diet and lifestyle recommendations do not help, your health care provider may recommend taking over-the-counter laxative medicines to help you have bowel movements. Prescription medicines may be prescribed if over-the-counter medicines do not work.  HOME CARE INSTRUCTIONS   Eat foods that have a lot of fiber, such as fruits, vegetables, whole grains, and beans.  Limit foods high in fat and processed sugars, such as french fries, hamburgers, cookies, candies, and soda.   A fiber supplement may be added to your diet if you cannot get enough fiber from foods.   Drink enough fluids to keep your urine clear or pale yellow.   Exercise regularly or as directed by your health care provider.   Go to the restroom when you have the urge to go. Do not hold it.   Only take over-the-counter or prescription medicines  as directed by your health care provider. Do not take other medicines for constipation without talking to your health care provider first.  Roy IF:   You have bright red blood in your stool.   Your constipation lasts for more than 4 days or gets worse.   You have abdominal or rectal pain.   You have thin, pencil-like stools.   You have unexplained weight loss. MAKE SURE YOU:    Understand these instructions.  Will watch your condition.  Will get help right away if you are not doing well or get worse. Document Released: 08/21/2004 Document Revised: 11/28/2013 Document Reviewed: 09/04/2013 [facility] Patient Information 2015 [place], [place]. This information is not intended to replace advice given to you by your health care provider. Make sure you discuss any questions you have with your health care provider.

## 2015-07-11 NOTE — Addendum Note (Signed)
Addended by: Jones Bales on: 07/11/2015 05:19 PM   Modules accepted: Level of Service

## 2015-07-15 NOTE — Progress Notes (Signed)
Internal Medicine Clinic Attending  Case discussed with Dr. Gill soon after the resident saw the patient.  We reviewed the resident's history and exam and pertinent patient test results.  I agree with the assessment, diagnosis, and plan of care documented in the resident's note.  

## 2015-12-03 ENCOUNTER — Telehealth: Payer: Self-pay | Admitting: Internal Medicine

## 2015-12-03 NOTE — Telephone Encounter (Signed)
Call to patient to confirm appointment for 12/04/15 at 1:45 no answer.

## 2015-12-04 ENCOUNTER — Ambulatory Visit: Payer: Self-pay | Admitting: Internal Medicine

## 2016-01-20 ENCOUNTER — Encounter: Payer: Self-pay | Admitting: Internal Medicine

## 2016-01-22 ENCOUNTER — Other Ambulatory Visit: Payer: Self-pay

## 2016-01-24 MED ORDER — AMLODIPINE BESYLATE 5 MG PO TABS: 5.0000 mg | ORAL_TABLET | Freq: Every day | ORAL | Status: DC

## 2016-01-27 ENCOUNTER — Ambulatory Visit (INDEPENDENT_AMBULATORY_CARE_PROVIDER_SITE_OTHER): Payer: Medicaid Other | Admitting: Internal Medicine

## 2016-01-27 ENCOUNTER — Encounter: Payer: Self-pay | Admitting: Internal Medicine

## 2016-01-27 VITALS — BP 125/83 | HR 102 | Temp 98.3°F | Ht 61.0 in | Wt 115.0 lb

## 2016-01-27 DIAGNOSIS — I10 Essential (primary) hypertension: Secondary | ICD-10-CM | POA: Diagnosis present

## 2016-01-27 DIAGNOSIS — F17210 Nicotine dependence, cigarettes, uncomplicated: Secondary | ICD-10-CM

## 2016-01-27 DIAGNOSIS — M810 Age-related osteoporosis without current pathological fracture: Secondary | ICD-10-CM | POA: Diagnosis not present

## 2016-01-27 DIAGNOSIS — K2270 Barrett's esophagus without dysplasia: Secondary | ICD-10-CM | POA: Diagnosis not present

## 2016-01-27 DIAGNOSIS — J302 Other seasonal allergic rhinitis: Secondary | ICD-10-CM

## 2016-01-27 DIAGNOSIS — Z720 Tobacco use: Secondary | ICD-10-CM

## 2016-01-27 DIAGNOSIS — Z0000 Encounter for general adult medical examination without abnormal findings: Secondary | ICD-10-CM

## 2016-01-27 DIAGNOSIS — J309 Allergic rhinitis, unspecified: Secondary | ICD-10-CM

## 2016-01-27 DIAGNOSIS — K22719 Barrett's esophagus with dysplasia, unspecified: Secondary | ICD-10-CM

## 2016-01-27 DIAGNOSIS — E785 Hyperlipidemia, unspecified: Secondary | ICD-10-CM | POA: Diagnosis not present

## 2016-01-27 MED ORDER — FLUTICASONE PROPIONATE 50 MCG/ACT NA SUSP: 2.0000 | Freq: Every day | NASAL | Status: DC

## 2016-01-27 MED ORDER — FAMOTIDINE 20 MG PO TABS: 20.0000 mg | ORAL_TABLET | Freq: Every day | ORAL | Status: DC | PRN

## 2016-01-27 MED ORDER — CLONIDINE HCL 0.2 MG PO TABS: 0.2000 mg | ORAL_TABLET | Freq: Two times a day (BID) | ORAL | Status: DC

## 2016-01-27 NOTE — Patient Instructions (Signed)
Thank you for your visit today.   Please return to the internal medicine clinic in 5-6 month(s) or sooner if needed.     I have made the following additions/changes to your medications: Continue current medications. I will give you some nasal spray for your allergies.   Please be sure to bring all of your medications with you to every visit; this includes herbal supplements, vitamins, eye drops, and any over-the-counter medications.   Should you have any questions regarding your medications and/or any new or worsening symptoms, please be sure to call the clinic at [phone].   If you believe that you are suffering from a life threatening condition or one that may result in the loss of limb or function, then you should call 911 and proceed to the nearest Emergency Department.   A healthy lifestyle and preventative care can promote health and wellness.   Maintain regular health, dental, and eye exams.  Eat a healthy diet. Foods like vegetables, fruits, whole grains, low-fat dairy products, and lean protein foods contain the nutrients you need without too many calories. Decrease your intake of foods high in solid fats, added sugars, and salt. Get information about a proper diet from your caregiver, if necessary.  Regular physical exercise is one of the most important things you can do for your health. Most adults should get at least 150 minutes of moderate-intensity exercise (any activity that increases your heart rate and causes you to sweat) each week. In addition, most adults need muscle-strengthening exercises on 2 or more days a week.   Maintain a healthy weight. The body mass index (BMI) is a screening tool to identify possible weight problems. It provides an estimate of body fat based on height and weight. Your caregiver can help determine your BMI, and can help you achieve or maintain a healthy weight. For adults 20 years and older:  A BMI below 18.5 is considered underweight.  A  BMI of 18.5 to 24.9 is normal.  A BMI of 25 to 29.9 is considered overweight.  A BMI of 30 and above is considered obese.
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
## 2016-01-30 NOTE — Assessment & Plan Note (Addendum)
A: Dyslipidemia P: Lipid panel (requested she get a lipid panel but she did not drop by the lab) Cont zocor

## 2016-01-30 NOTE — Assessment & Plan Note (Signed)
A: Osteoporosis P: Will need to repeat DEXA at next OV and inquire if she is taking vitD/calcium

## 2016-01-30 NOTE — Assessment & Plan Note (Signed)
A: Allergic rhinitis P: Advised her to use flonase and saline nasal spray

## 2016-01-30 NOTE — Assessment & Plan Note (Addendum)
-  Refused hepatitis C and HIV screening  -Refused influenza vaccine

## 2016-01-30 NOTE — Assessment & Plan Note (Signed)
A: Tobacco abuse P: Discussed smoking cessation, but does not appear ready to quit

## 2016-01-30 NOTE — Assessment & Plan Note (Signed)
A: Barrett's esophagus: she has f/u with Dr. Hilarie Fredrickson in 2018, Brittany Burns has given her the follow up letter at her last OV.  She is taking prilosec twice daily and states that she would also like pepcid for times when she feels her GERD is not well controlled with pepcid.  It sounds as if she only takes the pepcid once daily at times.   P: Refilled pepcid  Cont prilosec F/u with Dr. Hilarie Fredrickson  Advised smoking cessation as this will worsen her GERD symptoms

## 2016-01-30 NOTE — Assessment & Plan Note (Addendum)
A: BP well controlled.  Pt reports she is taking 1/2 tab (0.1mg ) of clonidine in the AM and then 1 tab (0.2mg )  in the PM because she gets very sleepy when taking a whole tab in the AM.  I think it would be a good idea to try and taper off the clonidine in the future but since she has been on it for such a long time it may be difficult.  She also does not follow up with our clinic very often.  She is also on amlodipine 5mg  qd.  P: Cont current meds Check lipid panel (she apparently did not stop by the lab stating "she was afraid" of needles)

## 2016-02-03 NOTE — Progress Notes (Signed)
Internal Medicine Clinic Attending  Case discussed with Dr. Gill soon after the resident saw the patient.  We reviewed the resident's history and exam and pertinent patient test results.  I agree with the assessment, diagnosis, and plan of care documented in the resident's note.  

## 2016-03-13 ENCOUNTER — Other Ambulatory Visit: Payer: Self-pay | Admitting: Internal Medicine

## 2016-03-16 ENCOUNTER — Other Ambulatory Visit: Payer: Self-pay | Admitting: *Deleted

## 2016-03-17 MED ORDER — AMLODIPINE BESYLATE 5 MG PO TABS: 5.0000 mg | ORAL_TABLET | Freq: Every day | ORAL | Status: DC

## 2016-03-23 ENCOUNTER — Other Ambulatory Visit: Payer: Self-pay

## 2016-03-23 NOTE — Telephone Encounter (Signed)
Pt requesting famotidine to be filled @ [facility].

## 2016-03-23 NOTE — Telephone Encounter (Signed)
Called pharm, she has refills, they will call her and deliver it

## 2016-03-31 NOTE — Addendum Note (Signed)
Addended by: Truddie Crumble on: 03/31/2016 08:55 AM   Modules accepted: Orders, SmartSet

## 2016-04-09 ENCOUNTER — Other Ambulatory Visit: Payer: Self-pay | Admitting: Internal Medicine

## 2016-04-15 ENCOUNTER — Emergency Department (HOSPITAL_COMMUNITY): Payer: Medicaid Other

## 2016-04-15 ENCOUNTER — Encounter (HOSPITAL_COMMUNITY): Payer: Self-pay | Admitting: Emergency Medicine

## 2016-04-15 ENCOUNTER — Emergency Department (HOSPITAL_COMMUNITY)
Admission: EM | Admit: 2016-04-15 | Discharge: 2016-04-15 | Disposition: A | Discharge status: Home / Self Care | Payer: Medicaid Other | Attending: Emergency Medicine | Admitting: Emergency Medicine

## 2016-04-15 DIAGNOSIS — M25562 Pain in left knee: Secondary | ICD-10-CM

## 2016-04-15 DIAGNOSIS — F17210 Nicotine dependence, cigarettes, uncomplicated: Secondary | ICD-10-CM | POA: Diagnosis not present

## 2016-04-15 DIAGNOSIS — S50312A Abrasion of left elbow, initial encounter: Secondary | ICD-10-CM | POA: Insufficient documentation

## 2016-04-15 DIAGNOSIS — Y92410 Unspecified street and highway as the place of occurrence of the external cause: Secondary | ICD-10-CM | POA: Insufficient documentation

## 2016-04-15 DIAGNOSIS — Z79899 Other long term (current) drug therapy: Secondary | ICD-10-CM | POA: Diagnosis not present

## 2016-04-15 DIAGNOSIS — K219 Gastro-esophageal reflux disease without esophagitis: Secondary | ICD-10-CM | POA: Diagnosis not present

## 2016-04-15 DIAGNOSIS — I10 Essential (primary) hypertension: Secondary | ICD-10-CM | POA: Insufficient documentation

## 2016-04-15 DIAGNOSIS — S8992XA Unspecified injury of left lower leg, initial encounter: Secondary | ICD-10-CM | POA: Diagnosis present

## 2016-04-15 DIAGNOSIS — Y998 Other external cause status: Secondary | ICD-10-CM | POA: Diagnosis not present

## 2016-04-15 DIAGNOSIS — Z7951 Long term (current) use of inhaled steroids: Secondary | ICD-10-CM | POA: Insufficient documentation

## 2016-04-15 DIAGNOSIS — Y9389 Activity, other specified: Secondary | ICD-10-CM | POA: Insufficient documentation

## 2016-04-15 DIAGNOSIS — S60812A Abrasion of left wrist, initial encounter: Secondary | ICD-10-CM | POA: Diagnosis not present

## 2016-04-15 DIAGNOSIS — Z8781 Personal history of (healed) traumatic fracture: Secondary | ICD-10-CM | POA: Diagnosis not present

## 2016-04-15 DIAGNOSIS — E785 Hyperlipidemia, unspecified: Secondary | ICD-10-CM | POA: Insufficient documentation

## 2016-04-15 DIAGNOSIS — M25522 Pain in left elbow: Secondary | ICD-10-CM

## 2016-04-15 DIAGNOSIS — M25532 Pain in left wrist: Secondary | ICD-10-CM

## 2016-04-15 DIAGNOSIS — S80212A Abrasion, left knee, initial encounter: Secondary | ICD-10-CM | POA: Insufficient documentation

## 2016-04-15 DIAGNOSIS — Z8673 Personal history of transient ischemic attack (TIA), and cerebral infarction without residual deficits: Secondary | ICD-10-CM | POA: Diagnosis not present

## 2016-04-15 MED ORDER — METHOCARBAMOL 500 MG PO TABS: 500.0000 mg | ORAL_TABLET | Freq: Two times a day (BID) | ORAL | Status: DC

## 2016-04-15 MED ORDER — NAPROXEN 500 MG PO TABS: 500.0000 mg | ORAL_TABLET | Freq: Two times a day (BID) | ORAL | Status: DC
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
## 2016-05-18 ENCOUNTER — Encounter: Payer: Self-pay | Admitting: *Deleted

## 2016-06-03 ENCOUNTER — Other Ambulatory Visit: Payer: Self-pay

## 2016-06-03 NOTE — Telephone Encounter (Signed)
Pt requesting amlodipine to be filled @ [facility].

## 2016-06-04 MED ORDER — AMLODIPINE BESYLATE 5 MG PO TABS: 5.0000 mg | ORAL_TABLET | Freq: Every day | ORAL | Status: DC

## 2016-06-26 ENCOUNTER — Other Ambulatory Visit: Payer: Self-pay | Admitting: *Deleted

## 2016-06-26 DIAGNOSIS — E785 Hyperlipidemia, unspecified: Secondary | ICD-10-CM

## 2016-06-26 MED ORDER — SIMVASTATIN 20 MG PO TABS: 20.0000 mg | ORAL_TABLET | Freq: Every evening | ORAL | Status: DC

## 2016-06-30 ENCOUNTER — Telehealth: Payer: Self-pay

## 2016-06-30 NOTE — Telephone Encounter (Signed)
Requesting simvastatin to be filled.

## 2016-07-01 ENCOUNTER — Other Ambulatory Visit: Payer: Self-pay | Admitting: *Deleted

## 2016-07-01 MED ORDER — OMEPRAZOLE 40 MG PO CPDR: DELAYED_RELEASE_CAPSULE | ORAL | 1 refills | Status: DC

## 2016-07-10 ENCOUNTER — Ambulatory Visit (INDEPENDENT_AMBULATORY_CARE_PROVIDER_SITE_OTHER): Payer: Medicaid Other | Admitting: Internal Medicine

## 2016-07-10 VITALS — BP 126/73 | HR 84 | Temp 98.4°F | Wt 110.8 lb

## 2016-07-10 DIAGNOSIS — M810 Age-related osteoporosis without current pathological fracture: Secondary | ICD-10-CM

## 2016-07-10 DIAGNOSIS — Z7982 Long term (current) use of aspirin: Secondary | ICD-10-CM | POA: Diagnosis not present

## 2016-07-10 DIAGNOSIS — Z720 Tobacco use: Secondary | ICD-10-CM

## 2016-07-10 DIAGNOSIS — K2100 Gastro-esophageal reflux disease with esophagitis, without bleeding: Secondary | ICD-10-CM

## 2016-07-10 DIAGNOSIS — Z8673 Personal history of transient ischemic attack (TIA), and cerebral infarction without residual deficits: Secondary | ICD-10-CM

## 2016-07-10 DIAGNOSIS — K219 Gastro-esophageal reflux disease without esophagitis: Secondary | ICD-10-CM

## 2016-07-10 DIAGNOSIS — I10 Essential (primary) hypertension: Secondary | ICD-10-CM

## 2016-07-10 DIAGNOSIS — F17210 Nicotine dependence, cigarettes, uncomplicated: Secondary | ICD-10-CM

## 2016-07-10 DIAGNOSIS — E785 Hyperlipidemia, unspecified: Secondary | ICD-10-CM

## 2016-07-10 DIAGNOSIS — I6330 Cerebral infarction due to thrombosis of unspecified cerebral artery: Secondary | ICD-10-CM

## 2016-07-10 DIAGNOSIS — Z760 Encounter for issue of repeat prescription: Secondary | ICD-10-CM

## 2016-07-10 DIAGNOSIS — Z79899 Other long term (current) drug therapy: Secondary | ICD-10-CM

## 2016-07-10 MED ORDER — AMLODIPINE BESYLATE 5 MG PO TABS: 5.0000 mg | ORAL_TABLET | Freq: Every day | ORAL | 0 refills | Status: DC

## 2016-07-10 MED ORDER — SIMVASTATIN 20 MG PO TABS: 20.0000 mg | ORAL_TABLET | Freq: Every evening | ORAL | 3 refills | Status: DC

## 2016-07-10 MED ORDER — ASPIRIN EC 81 MG PO TBEC: 81.0000 mg | DELAYED_RELEASE_TABLET | Freq: Every day | ORAL | 2 refills | Status: AC

## 2016-07-10 MED ORDER — FAMOTIDINE 20 MG PO TABS: ORAL_TABLET | ORAL | 1 refills | Status: DC

## 2016-07-10 NOTE — Assessment & Plan Note (Signed)
Patient takes vit D and calcium supplements daily and maintains a healthy weight and active lifestyle (takes walks often)

## 2016-07-10 NOTE — Assessment & Plan Note (Signed)
Recheck lipid panel next week

## 2016-07-10 NOTE — Patient Instructions (Signed)
Please continue to take your medications as prescribed, stay active and eat healthy foods! Keep up the good work. See you in one week for some lab work.

## 2016-07-10 NOTE — Assessment & Plan Note (Signed)
Started Asa 81mg  QD antiplatelet for secondary ppx

## 2016-07-10 NOTE — Assessment & Plan Note (Signed)
Refilled Pepcid, ongoing GERD symptoms exacerbated by smoking
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
## 2016-07-10 NOTE — Progress Notes (Signed)
Medicine attending: I personally interviewed and briefly examined this patient on the day of the patient visit and reviewed pertinent clinical and laboratory data  with resident physician Dr. Asencion Partridge and we discussed a management plan.

## 2016-07-10 NOTE — Assessment & Plan Note (Signed)
Discussed tobacco sensation, patient says she smokes 1 pack per 3 days and often chews gum instead, close to quitting but not ready to commit just yet. Discussed adjuncts such as NRT, chantix, bupropion - patient may consider these in the future.

## 2016-07-10 NOTE — Assessment & Plan Note (Signed)
Currently well controlled, continue Amlodipine and Clonidine at current dose

## 2016-07-24 ENCOUNTER — Telehealth: Payer: Self-pay | Admitting: Licensed Clinical Social Worker

## 2016-07-24 NOTE — Telephone Encounter (Signed)
CSW received voice mail from Brittany Burns.  Pt states she is requesting a reasonable accommodation and was instructed by her housing manager to obtain a physcian's letter.  Brittany Burns obtained the letter and provided however, complex is requesting another form to be signed by Brittany Burns.  Pt requesting not to have to speak with complex manager again as it's causes her undue stress.  CSW attempted to return call, unable to leave a voice mail at this time. Brittany Burns returned call to [place].  Pt states the above. CSW offered to Research officer, trade union and request form to either be mailed to Brittany Burns for signature or to be faxed to [facility] for [facility] to obtain signature.  Brittany Burns provided CSW with name and phone number of apartment manager, Brittany Burns [phone]. CSW placed called to apartment manager.  CSW left message requesting reasonable accommodation form to be faxed to [facility] at [phone] or mailed to Brittany Burns directly.  CSW provided contact hours and phone number should return call be necessary.

## 2016-08-03 NOTE — Telephone Encounter (Signed)
CSW placed call to Ms. Sharp's [employer], therapeutic and requested reasonable accommodation form to be faxed to [facility], as pt is requesting stair rails.  Apartment manager states fax will be sent over today.

## 2016-08-14 ENCOUNTER — Other Ambulatory Visit: Payer: Self-pay | Admitting: Internal Medicine

## 2016-10-20 ENCOUNTER — Encounter (HOSPITAL_COMMUNITY): Payer: Self-pay | Admitting: Emergency Medicine

## 2016-10-20 ENCOUNTER — Emergency Department (HOSPITAL_COMMUNITY)
Admission: EM | Admit: 2016-10-20 | Discharge: 2016-10-20 | Disposition: A | Discharge status: Home / Self Care | Payer: Medicaid Other | Attending: Emergency Medicine | Admitting: Emergency Medicine

## 2016-10-20 DIAGNOSIS — F17210 Nicotine dependence, cigarettes, uncomplicated: Secondary | ICD-10-CM | POA: Diagnosis not present

## 2016-10-20 DIAGNOSIS — I10 Essential (primary) hypertension: Secondary | ICD-10-CM | POA: Insufficient documentation

## 2016-10-20 DIAGNOSIS — Z7982 Long term (current) use of aspirin: Secondary | ICD-10-CM | POA: Diagnosis not present

## 2016-10-20 DIAGNOSIS — Z8673 Personal history of transient ischemic attack (TIA), and cerebral infarction without residual deficits: Secondary | ICD-10-CM | POA: Diagnosis not present

## 2016-10-20 DIAGNOSIS — Z5321 Procedure and treatment not carried out due to patient leaving prior to being seen by health care provider: Secondary | ICD-10-CM | POA: Insufficient documentation

## 2016-10-20 DIAGNOSIS — R112 Nausea with vomiting, unspecified: Secondary | ICD-10-CM | POA: Diagnosis present

## 2016-10-20 LAB — COMPREHENSIVE METABOLIC PANEL
ALBUMIN: 4.9 g/dL (ref 3.5–5.0)
ALK PHOS: 90 U/L (ref 38–126)
ALT: 22 U/L (ref 14–54)
AST: 22 U/L (ref 15–41)
Anion gap: 13 (ref 5–15)
BILIRUBIN TOTAL: 0.9 mg/dL (ref 0.3–1.2)
BUN: 17 mg/dL (ref 6–20)
CALCIUM: 10.1 mg/dL (ref 8.9–10.3)
CO2: 25 mmol/L (ref 22–32)
Chloride: 102 mmol/L (ref 101–111)
Creatinine, Ser: 0.76 mg/dL (ref 0.44–1.00)
GFR calc Af Amer: >60 mL/min (ref >60)
GFR calc non Af Amer: >60 mL/min (ref >60)
GLUCOSE: 132 mg/dL — AB (ref 65–99)
Potassium: 3.3 mmol/L — ABNORMAL LOW (ref 3.5–5.1)
Sodium: 140 mmol/L (ref 135–145)
TOTAL PROTEIN: 8.3 g/dL — AB (ref 6.5–8.1)

## 2016-10-20 LAB — CBC
HEMATOCRIT: 49.3 % — AB (ref 36.0–46.0)
Hemoglobin: 17.3 g/dL — ABNORMAL HIGH (ref 12.0–15.0)
MCH: 32.7 pg (ref 26.0–34.0)
MCHC: 35.1 g/dL (ref 30.0–36.0)
MCV: 93.2 fL (ref 78.0–100.0)
Platelets: 299 10*3/uL (ref 150–400)
RBC: 5.29 MIL/uL — ABNORMAL HIGH (ref 3.87–5.11)
RDW: 13.9 % (ref 11.5–15.5)
WBC: 10.2 10*3/uL (ref 4.0–10.5)

## 2016-10-20 LAB — LIPASE, BLOOD: Lipase: 26 U/L (ref 11–51)

## 2016-10-20 MED ORDER — ONDANSETRON 4 MG PO TBDP
4.0000 mg | ORAL_TABLET | Freq: Once | ORAL | Status: AC | PRN
  Administered 2016-10-20: 4 mg via ORAL

## 2016-10-20 MED ORDER — ONDANSETRON 4 MG PO TBDP
ORAL_TABLET | ORAL | Status: AC
  Filled 2016-10-20: qty 1

## 2016-10-20 NOTE — ED Triage Notes (Signed)
Pt states since Sunday she has felt like she has acid coming into her throat with nausea and vomiting. Pt denies any pain in chest or abd reports just "belching".

## 2016-11-11 ENCOUNTER — Other Ambulatory Visit: Payer: Self-pay | Admitting: Internal Medicine

## 2017-01-08 ENCOUNTER — Encounter (INDEPENDENT_AMBULATORY_CARE_PROVIDER_SITE_OTHER): Payer: Self-pay

## 2017-01-08 ENCOUNTER — Encounter: Payer: Self-pay | Admitting: Internal Medicine

## 2017-01-08 ENCOUNTER — Ambulatory Visit (INDEPENDENT_AMBULATORY_CARE_PROVIDER_SITE_OTHER): Payer: Medicaid Other | Admitting: Internal Medicine

## 2017-01-08 VITALS — BP 147/76 | HR 100 | Temp 97.9°F | Wt 110.5 lb

## 2017-01-08 DIAGNOSIS — J069 Acute upper respiratory infection, unspecified: Secondary | ICD-10-CM

## 2017-01-08 DIAGNOSIS — Z79899 Other long term (current) drug therapy: Secondary | ICD-10-CM | POA: Diagnosis not present

## 2017-01-08 DIAGNOSIS — R05 Cough: Secondary | ICD-10-CM

## 2017-01-08 DIAGNOSIS — B9789 Other viral agents as the cause of diseases classified elsewhere: Principal | ICD-10-CM

## 2017-01-08 DIAGNOSIS — J3489 Other specified disorders of nose and nasal sinuses: Secondary | ICD-10-CM

## 2017-01-08 DIAGNOSIS — R0989 Other specified symptoms and signs involving the circulatory and respiratory systems: Secondary | ICD-10-CM

## 2017-01-08 DIAGNOSIS — F17210 Nicotine dependence, cigarettes, uncomplicated: Secondary | ICD-10-CM

## 2017-01-08 DIAGNOSIS — I10 Essential (primary) hypertension: Secondary | ICD-10-CM

## 2017-01-08 MED ORDER — DM-GUAIFENESIN ER 30-600 MG PO TB12: 1.0000 | ORAL_TABLET | Freq: Two times a day (BID) | ORAL | 1 refills | Status: DC

## 2017-01-08 NOTE — Assessment & Plan Note (Signed)
Presents with approximately one week of cough (initially productive of scant green/yellow phlegm, now dry), congestion, rhinorrhea, post-nasal drip, sneezing. Denies any dyspnea, fevers, chills, fatigue, myalgias. She has had a good appetite and is remaining well hydrated. She has been taking dextromethorphan-guaifenesin OTC tablets with symptom relief. She reports that today has been her best day so far and she feels like her symptoms are improving. Her vitals and exam are benign, no hypoxia or fever, oropharynx clear, no LAD, lungs CTAB. Most consistent with viral URI.   Plan: - Continue supportive care, hydration, sleep, OTCs - Provided Rx for Mucinex DM tablets BID PRN - Advised to return to clinic or go to urgent care of ED if her symptoms should worsen

## 2017-01-08 NOTE — Assessment & Plan Note (Signed)
Initial BP elevated at 147/76, on manual recheck 30 minutes later 126/86. At goal, patient reports being stressed over not being able to find a parking space and rushing here this afternoon. Reports full compliance with her Amlodipine and Clonidine.  Plan: - Continue Amlodipine 5 mg and Clonidine 0.2 mg BID - Consider weaning / discontinuing clonidine and exchanging for a better antihypertensive in the future

## 2017-01-08 NOTE — Patient Instructions (Signed)
Please continue to take your medications as prescribed.  It seems that you have come down with a bad cold, most likely from a virus.   This should get better on it's own in a few days as long as you stay well-hydrated and get plenty of sleep.   You can continue to take over the counter medications for cough.  If you start feeling worse and have fevers, worse cough, or feel short of breath you should schedule a same-day appointment and come see [place] in our clinic [phone]) or visit the ER or urgent care.  Please return in 3 month for a follow up visit.
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
## 2017-01-11 NOTE — Progress Notes (Signed)
Internal Medicine Clinic Attending  Case discussed with Dr. Johnson at the time of the visit.  We reviewed the resident's history and exam and pertinent patient test results.  I agree with the assessment, diagnosis, and plan of care documented in the resident's note.  

## 2017-01-21 ENCOUNTER — Other Ambulatory Visit: Payer: Self-pay | Admitting: Internal Medicine

## 2017-05-08 ENCOUNTER — Other Ambulatory Visit: Payer: Self-pay | Admitting: Internal Medicine

## 2017-05-08 DIAGNOSIS — K2100 Gastro-esophageal reflux disease with esophagitis, without bleeding: Secondary | ICD-10-CM

## 2017-05-17 ENCOUNTER — Encounter: Payer: Self-pay | Admitting: *Deleted

## 2017-05-31 ENCOUNTER — Other Ambulatory Visit: Payer: Self-pay | Admitting: Internal Medicine

## 2017-06-02 ENCOUNTER — Encounter: Payer: Self-pay | Admitting: Internal Medicine

## 2017-06-03 ENCOUNTER — Other Ambulatory Visit: Payer: Self-pay | Admitting: Internal Medicine

## 2017-06-04 ENCOUNTER — Other Ambulatory Visit: Payer: Self-pay | Admitting: *Deleted

## 2017-06-04 DIAGNOSIS — I10 Essential (primary) hypertension: Secondary | ICD-10-CM

## 2017-06-04 MED ORDER — CLONIDINE HCL 0.2 MG PO TABS: 0.2000 mg | ORAL_TABLET | Freq: Two times a day (BID) | ORAL | 11 refills | Status: DC

## 2017-06-04 NOTE — Telephone Encounter (Signed)
Clonidine rx called to [facility]; pt informed .

## 2017-07-22 ENCOUNTER — Ambulatory Visit: Payer: Self-pay

## 2017-07-26 ENCOUNTER — Other Ambulatory Visit: Payer: Self-pay | Admitting: Student in an Organized Health Care Education/Training Program

## 2017-07-26 DIAGNOSIS — K219 Gastro-esophageal reflux disease without esophagitis: Secondary | ICD-10-CM

## 2017-07-28 ENCOUNTER — Other Ambulatory Visit: Payer: Self-pay | Admitting: *Deleted

## 2017-07-28 DIAGNOSIS — E785 Hyperlipidemia, unspecified: Secondary | ICD-10-CM

## 2017-07-29 MED ORDER — SIMVASTATIN 20 MG PO TABS: 20.0000 mg | ORAL_TABLET | Freq: Every evening | ORAL | 3 refills | Status: DC

## 2017-08-27 ENCOUNTER — Ambulatory Visit (HOSPITAL_COMMUNITY)
Admission: RE | Admit: 2017-08-27 | Discharge: 2017-08-27 | Disposition: A | Discharge status: Home / Self Care | Payer: Medicaid Other | Source: Ambulatory Visit | Attending: Student in an Organized Health Care Education/Training Program | Admitting: Student in an Organized Health Care Education/Training Program

## 2017-08-27 ENCOUNTER — Other Ambulatory Visit: Payer: Self-pay | Admitting: *Deleted

## 2017-08-27 ENCOUNTER — Telehealth: Payer: Self-pay | Admitting: Internal Medicine

## 2017-08-27 ENCOUNTER — Encounter: Payer: Self-pay | Admitting: Internal Medicine

## 2017-08-27 ENCOUNTER — Ambulatory Visit (INDEPENDENT_AMBULATORY_CARE_PROVIDER_SITE_OTHER): Payer: Medicaid Other | Admitting: Internal Medicine

## 2017-08-27 VITALS — BP 129/79 | HR 67 | Temp 97.5°F | Ht 63.0 in | Wt 108.3 lb

## 2017-08-27 DIAGNOSIS — Z8249 Family history of ischemic heart disease and other diseases of the circulatory system: Secondary | ICD-10-CM | POA: Diagnosis not present

## 2017-08-27 DIAGNOSIS — Z833 Family history of diabetes mellitus: Secondary | ICD-10-CM

## 2017-08-27 DIAGNOSIS — F17210 Nicotine dependence, cigarettes, uncomplicated: Secondary | ICD-10-CM | POA: Diagnosis not present

## 2017-08-27 DIAGNOSIS — R0781 Pleurodynia: Secondary | ICD-10-CM | POA: Insufficient documentation

## 2017-08-27 DIAGNOSIS — Z8379 Family history of other diseases of the digestive system: Secondary | ICD-10-CM | POA: Diagnosis not present

## 2017-08-27 DIAGNOSIS — Z79899 Other long term (current) drug therapy: Secondary | ICD-10-CM

## 2017-08-27 DIAGNOSIS — M791 Myalgia: Secondary | ICD-10-CM

## 2017-08-27 DIAGNOSIS — J302 Other seasonal allergic rhinitis: Secondary | ICD-10-CM

## 2017-08-27 DIAGNOSIS — J449 Chronic obstructive pulmonary disease, unspecified: Secondary | ICD-10-CM | POA: Insufficient documentation

## 2017-08-27 DIAGNOSIS — I10 Essential (primary) hypertension: Secondary | ICD-10-CM

## 2017-08-27 MED ORDER — TRAMADOL HCL 50 MG PO TABS: 50.0000 mg | ORAL_TABLET | Freq: Two times a day (BID) | ORAL | 0 refills | Status: AC | PRN

## 2017-08-27 MED ORDER — CETIRIZINE HCL 10 MG PO TABS: 10.0000 mg | ORAL_TABLET | Freq: Every day | ORAL | 1 refills | Status: DC

## 2017-08-27 NOTE — Patient Instructions (Signed)
We have ordered a chest x-ray to evaluate your rib pain.  We will contact you with the results. I have also ordered tramadol 50mg  for you to take in the mean time to help with your pain, please take as prescribed.  The hope is that you just strained your intercostal muscles (muscle between ribs) and it will heal soon.  Please try and quit smoking, this will allow you to heal more quickly and improve your overall health.

## 2017-08-27 NOTE — Assessment & Plan Note (Signed)
Based on physical exam findings and history written above the pain seems to be more of musculoskeletal in nature.  There was no rash present suggesting herpes zoster. There is tenderness to palpation on the rib itself.  It is possible that the patient has intercostal muscle strain. The amount of pain the patient is in very significant.  It is difficult for her to lay down or turn her body.    -Chest x-ray to rule out any bony abnormalities, or other visible acute issues. -prescribed short course of tramadol due to NSAIDs being ineffective.
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
## 2017-08-27 NOTE — Assessment & Plan Note (Signed)
patient's blood pressure is at goal today and is 129/79.  During her last visit it was 126/86.  she denies any issues with her medications or any symptoms of hypotension.  -continue amlodipine 5 mg clonidine 0.2mg  twice a day

## 2017-08-27 NOTE — Telephone Encounter (Signed)
Told pt about results of x-ray being normal. Continue current plan

## 2017-08-30 NOTE — Progress Notes (Signed)
Internal Medicine Clinic Attending  I saw and evaluated the patient.  I personally confirmed the key portions of the history and exam documented by Dr. Winfrey and I reviewed pertinent patient test results.  The assessment, diagnosis, and plan were formulated together and I agree with the documentation in the resident's note. 

## 2018-01-03 ENCOUNTER — Other Ambulatory Visit: Payer: Self-pay | Admitting: Internal Medicine

## 2018-01-11 ENCOUNTER — Other Ambulatory Visit: Payer: Self-pay | Admitting: Internal Medicine

## 2018-01-20 ENCOUNTER — Ambulatory Visit: Payer: Self-pay

## 2018-01-27 ENCOUNTER — Ambulatory Visit: Payer: Self-pay

## 2018-02-03 ENCOUNTER — Other Ambulatory Visit: Payer: Self-pay

## 2018-02-03 ENCOUNTER — Encounter: Payer: Self-pay | Admitting: Internal Medicine

## 2018-02-03 ENCOUNTER — Ambulatory Visit (INDEPENDENT_AMBULATORY_CARE_PROVIDER_SITE_OTHER): Payer: Medicaid Other | Admitting: Internal Medicine

## 2018-02-03 VITALS — BP 138/82 | HR 73 | Temp 98.0°F | Ht 63.0 in | Wt 113.5 lb

## 2018-02-03 DIAGNOSIS — I10 Essential (primary) hypertension: Secondary | ICD-10-CM | POA: Diagnosis present

## 2018-02-03 DIAGNOSIS — J309 Allergic rhinitis, unspecified: Secondary | ICD-10-CM | POA: Diagnosis not present

## 2018-02-03 DIAGNOSIS — Z79899 Other long term (current) drug therapy: Secondary | ICD-10-CM

## 2018-02-03 DIAGNOSIS — D126 Benign neoplasm of colon, unspecified: Secondary | ICD-10-CM | POA: Diagnosis not present

## 2018-02-03 DIAGNOSIS — J3089 Other allergic rhinitis: Secondary | ICD-10-CM

## 2018-02-03 NOTE — Assessment & Plan Note (Signed)
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  Patient became very upset and stated she will not have a colonoscopy again as she had a bad experience last time.  She did not provide further details but was adamant about not having this procedure in the future. She also declined stool card. stated she is healthy and feels well.  She denied hematochezia, melena, and changes in bowel movements.  -Will continue to discuss this in future visits and hope patient is more agreeable to this

## 2018-02-03 NOTE — Assessment & Plan Note (Signed)
Patient complaining of severe nasal congestion associated with maxillary and frontal sinus tenderness.  She denies fever, chills, headache, and cough.  States she has a history of allergic rhinitis and this is usually how it presents.  She has been trying nasal irrigation as well as Zyrtec at home with improvement in symptoms.  - Continue symptomatic treatment - Follow-up as needed if symptoms fail to improve

## 2018-02-03 NOTE — Assessment & Plan Note (Signed)
Patient presents for HTN follow up. Currently on amlodipine 10 mg and clonidine 0.2 mg TID. Reports compliance. BP at goal, 138/82.   - Continue amlodipine and clonidine

## 2018-02-03 NOTE — Patient Instructions (Signed)
Ms. Heinemann,   Your blood pressure looks great! Continue taking amlodipine and clonidine as usual.   For your allergies, continue with nasal irrigation and Zyrtec.   Please call us if you have any questions.
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
## 2018-02-07 NOTE — Progress Notes (Signed)
Internal Medicine Clinic Attending  Case discussed with Dr. Santos-Sanchez at the time of the visit.  We reviewed the resident's history and exam and pertinent patient test results.  I agree with the assessment, diagnosis, and plan of care documented in the resident's note.    

## 2018-02-15 ENCOUNTER — Other Ambulatory Visit: Payer: Self-pay | Admitting: Internal Medicine

## 2018-02-15 DIAGNOSIS — E785 Hyperlipidemia, unspecified: Secondary | ICD-10-CM

## 2018-03-18 ENCOUNTER — Other Ambulatory Visit: Payer: Self-pay | Admitting: *Deleted

## 2018-03-18 DIAGNOSIS — J302 Other seasonal allergic rhinitis: Secondary | ICD-10-CM

## 2018-03-20 MED ORDER — CETIRIZINE HCL 10 MG PO TABS: 10.0000 mg | ORAL_TABLET | Freq: Every day | ORAL | 1 refills | Status: DC

## 2018-04-27 DIAGNOSIS — Z0100 Encounter for examination of eyes and vision without abnormal findings: Secondary | ICD-10-CM | POA: Diagnosis not present

## 2018-08-03 ENCOUNTER — Ambulatory Visit: Payer: Medicaid Other | Admitting: Internal Medicine

## 2018-08-03 VITALS — BP 113/80 | HR 86 | Temp 99.0°F | Wt 111.4 lb

## 2018-08-03 DIAGNOSIS — K2270 Barrett's esophagus without dysplasia: Secondary | ICD-10-CM | POA: Diagnosis not present

## 2018-08-03 DIAGNOSIS — I10 Essential (primary) hypertension: Secondary | ICD-10-CM | POA: Diagnosis not present

## 2018-08-03 DIAGNOSIS — M818 Other osteoporosis without current pathological fracture: Secondary | ICD-10-CM

## 2018-08-03 DIAGNOSIS — Z720 Tobacco use: Secondary | ICD-10-CM

## 2018-08-03 DIAGNOSIS — Z86010 Personal history of colonic polyps: Secondary | ICD-10-CM

## 2018-08-03 DIAGNOSIS — D582 Other hemoglobinopathies: Secondary | ICD-10-CM

## 2018-08-03 DIAGNOSIS — E785 Hyperlipidemia, unspecified: Secondary | ICD-10-CM

## 2018-08-03 DIAGNOSIS — Z79899 Other long term (current) drug therapy: Secondary | ICD-10-CM

## 2018-08-03 DIAGNOSIS — M810 Age-related osteoporosis without current pathological fracture: Secondary | ICD-10-CM | POA: Diagnosis not present

## 2018-08-03 DIAGNOSIS — Z0000 Encounter for general adult medical examination without abnormal findings: Secondary | ICD-10-CM

## 2018-08-03 DIAGNOSIS — K22719 Barrett's esophagus with dysplasia, unspecified: Secondary | ICD-10-CM

## 2018-08-03 MED ORDER — CALCIUM CITRATE-VITAMIN D 250-100 MG-UNIT PO TABS: 1.0000 | ORAL_TABLET | Freq: Two times a day (BID) | ORAL | 5 refills | Status: DC

## 2018-08-03 NOTE — Patient Instructions (Addendum)
Ms. Brittany Burns,   Continue taking blood pressure medications as usual.  I will send a prescription for calcium and vitamin D to your pharmacy.  Also continue taking omeprazole for your stomach.  Make sure to take this 30 minutes before breakfast.  You will be called by the breast cancer center to schedule your mammogram.  Please come back on Monday for blood work.  This is very very important.  Please call us if you have any questions or concerns.   - Dr. Frederico Hamman

## 2018-08-04 ENCOUNTER — Encounter: Payer: Self-pay | Admitting: Internal Medicine

## 2018-08-04 NOTE — Assessment & Plan Note (Signed)
HLD: Previously on simvastatin but stopped taking it because she was feeling well. Lipid profile from 2014 showed cholesterol 246, TG 219, HDL 42, and LDL 160. ASCVD 10% (moderate to high intensity statin recommended).  Discussed risk versus benefits of statin therapy as well as risk of cardiovascular event in the next 10 years.  Patient declined initiation of statin therapy at this time.  We will continue to discuss at future visits. - Lipid profile, patient she will come back on 9/2 for blood work

## 2018-08-04 NOTE — Assessment & Plan Note (Signed)
Barrett's esophagus: Patient has a history of Barrett's esophagus diagnosed in 2015 by EGD.  No dysplasia seen on pathology.  She is on both a H2 blocker and PPI, but states similarly makes her feel bloated she would like to stop taking it.  I advised her to stop famotidine but continue taking omeprazole once daily 30 minutes before breakfast.  I also recommended follow-up with GI as a repeat surveillance EGD is recommended in 3 to 5 years but she declined stating that she does not have any GI symptoms at this moment. - Continue omeprazole 20 mg daily

## 2018-08-04 NOTE — Assessment & Plan Note (Signed)
Osteoporosis: Patient had a DEXA scan in 2015 that showed T score of -2.7 at L1-L4 spine and -2.2 at R femur head. She is a thin female and a smoker, therefore high risk for fracture. No personal history or family history of fractures. No alcohol or glucocorticoid use.  She denies bone or joint pain.  Does not currently take calcium or vitamin D.  Ideally, would start a bisphosphonate given severe osteoporosis and DEXA scan.  However, patient declined as she is asymptomatic at this time.  She is agreeable to starting supplemental calcium and vitamin D. -Start calcium and vitamin D supplementation -Continue to discuss start of bisphosphonate therapy in the setting of severe osteoporosis and future visits as patient is high risk for fracture

## 2018-08-04 NOTE — Assessment & Plan Note (Signed)
Essential HTN: Patient is on amlodipine 10 mg QD and clonidine 0.2 mg BID. She is compliant with medication and her BP has been well controlled on this regimen. I offered her to simplify her regimen as she is not on first line therapy, but she declined as she feels comfortable taking the stated medications and her BP is well controlled.  - Continue current regimen - Ordered BMP, patient stated she will come back on 9/2 for blood work
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
## 2018-08-04 NOTE — Assessment & Plan Note (Signed)
Elevated hemoglobin: Patient noted to have persistent elevation in hemoglobin dating back to 11/2014.  Her most recent hemoglobin was 17.3 in 10/2016.  She has not had any blood work since then as she is afraid of needles.  Denies pruritus and erythromelalgia.  No history of thrombotic or hemorrhagic events.  She is a smoker which could explain this chronic elevation in hemoglobin and hematocrit.  However will need to evaluate for polycythemia vera as below.  Could also consider evaluation for OSA though she is a low risk for this. - Follow up CBC, EPO, and JAK2 mutation, and a, 9/2 for blood work

## 2018-08-04 NOTE — Assessment & Plan Note (Signed)
Preventive care: She declined colonoscopy, flu shot, and Pap smear today.  She was agreeable to a stool card which was provided.  Will follow-up. She has a history of tubulovillous adenoma but had a bad experience with her previous colonoscopy and is unwilling to undergo one again.  I have extensively discussed risks versus benefits of a colonoscopy for follow-up given her findings on previous one.  Also discussed that if stool card is positive the next it would be a colonoscopy.  She took stool card home but unsure if she would undergo colonoscopy if needed.

## 2018-08-05 NOTE — Progress Notes (Signed)
Internal Medicine Clinic Attending  Case discussed with Dr. Santos-Sanchez at the time of the visit.  We reviewed the resident's history and exam and pertinent patient test results.  I agree with the assessment, diagnosis, and plan of care documented in the resident's note.    

## 2018-08-08 DIAGNOSIS — Z0000 Encounter for general adult medical examination without abnormal findings: Secondary | ICD-10-CM | POA: Diagnosis not present

## 2018-08-09 ENCOUNTER — Other Ambulatory Visit (INDEPENDENT_AMBULATORY_CARE_PROVIDER_SITE_OTHER): Payer: Medicaid Other

## 2018-08-09 DIAGNOSIS — M818 Other osteoporosis without current pathological fracture: Secondary | ICD-10-CM | POA: Diagnosis not present

## 2018-08-09 DIAGNOSIS — D582 Other hemoglobinopathies: Secondary | ICD-10-CM | POA: Diagnosis not present

## 2018-08-09 DIAGNOSIS — Z0000 Encounter for general adult medical examination without abnormal findings: Secondary | ICD-10-CM

## 2018-08-09 DIAGNOSIS — I10 Essential (primary) hypertension: Secondary | ICD-10-CM | POA: Diagnosis not present

## 2018-08-09 DIAGNOSIS — E785 Hyperlipidemia, unspecified: Secondary | ICD-10-CM | POA: Diagnosis not present

## 2018-08-10 LAB — CMP14 + ANION GAP
ALT: 13 IU/L (ref 0–32)
AST: 14 IU/L (ref 0–40)
Albumin/Globulin Ratio: 2.2 (ref 1.2–2.2)
Albumin: 4.3 g/dL (ref 3.6–4.8)
Alkaline Phosphatase: 85 IU/L (ref 39–117)
Anion Gap: 15 mmol/L (ref 10.0–18.0)
BUN/Creatinine Ratio: 15 (ref 12–28)
BUN: 14 mg/dL (ref 8–27)
Bilirubin Total: 0.6 mg/dL (ref 0.0–1.2)
CO2: 21 mmol/L (ref 20–29)
Calcium: 8.9 mg/dL (ref 8.7–10.3)
Chloride: 107 mmol/L — ABNORMAL HIGH (ref 96–106)
Creatinine, Ser: 0.96 mg/dL (ref 0.57–1.00)
GFR calc Af Amer: 74 mL/min/{1.73_m2} (ref >59)
GFR calc non Af Amer: 64 mL/min/{1.73_m2} (ref >59)
Globulin, Total: 2 g/dL (ref 1.5–4.5)
Glucose: 83 mg/dL (ref 65–99)
Potassium: 4.1 mmol/L (ref 3.5–5.2)
Sodium: 143 mmol/L (ref 134–144)
Total Protein: 6.3 g/dL (ref 6.0–8.5)

## 2018-08-10 LAB — CBC WITH DIFFERENTIAL/PLATELET
Basophils Absolute: 0 10*3/uL (ref 0.0–0.2)
Basos: 0 %
EOS (ABSOLUTE): 0 10*3/uL (ref 0.0–0.4)
Eos: 1 %
Hematocrit: 38.9 % (ref 34.0–46.6)
Hemoglobin: 13.1 g/dL (ref 11.1–15.9)
Immature Grans (Abs): 0 10*3/uL (ref 0.0–0.1)
Immature Granulocytes: 0 %
Lymphocytes Absolute: 3 10*3/uL (ref 0.7–3.1)
Lymphs: 55 %
MCH: 31 pg (ref 26.6–33.0)
MCHC: 33.7 g/dL (ref 31.5–35.7)
MCV: 92 fL (ref 79–97)
Monocytes Absolute: 0.3 10*3/uL (ref 0.1–0.9)
Monocytes: 5 %
Neutrophils Absolute: 2.1 10*3/uL (ref 1.4–7.0)
Neutrophils: 39 %
Platelets: 275 10*3/uL (ref 150–450)
RBC: 4.22 x10E6/uL (ref 3.77–5.28)
RDW: 13.3 % (ref 12.3–15.4)
WBC: 5.5 10*3/uL (ref 3.4–10.8)

## 2018-08-10 LAB — LIPID PANEL
Chol/HDL Ratio: 3.6 ratio (ref 0.0–4.4)
Cholesterol, Total: 165 mg/dL (ref 100–199)
HDL: 46 mg/dL (ref >39)
LDL Calculated: 81 mg/dL (ref 0–99)
Triglycerides: 192 mg/dL — ABNORMAL HIGH (ref 0–149)
VLDL Cholesterol Cal: 38 mg/dL (ref 5–40)

## 2018-08-10 LAB — VITAMIN D 25 HYDROXY (VIT D DEFICIENCY, FRACTURES): Vit D, 25-Hydroxy: 14.5 ng/mL — ABNORMAL LOW (ref 30.0–100.0)

## 2018-08-10 LAB — ERYTHROPOIETIN: Erythropoietin: 12 m[IU]/mL (ref 2.6–18.5)

## 2018-08-11 LAB — FECAL OCCULT BLOOD, IMMUNOCHEMICAL: Fecal Occult Bld: NEGATIVE

## 2018-08-12 LAB — JAK2 GENOTYPR

## 2018-08-23 ENCOUNTER — Ambulatory Visit: Payer: Self-pay

## 2018-10-17 ENCOUNTER — Other Ambulatory Visit: Payer: Self-pay

## 2018-10-17 DIAGNOSIS — K219 Gastro-esophageal reflux disease without esophagitis: Secondary | ICD-10-CM

## 2018-10-17 MED ORDER — OMEPRAZOLE 20 MG PO CPDR: DELAYED_RELEASE_CAPSULE | ORAL | 1 refills | Status: DC

## 2018-10-17 NOTE — Telephone Encounter (Signed)
Requesting acid reflux med to be filled @ [facility] on [address].

## 2018-10-21 ENCOUNTER — Telehealth: Payer: Self-pay

## 2018-10-21 NOTE — Telephone Encounter (Signed)
No note in chart reflecting recent call. Med was refilled on 10/17/2018. Returned call to patient. No answer. Left message on VM stating above and to call back for any questions. Hubbard Hartshorn, RN, BSN

## 2018-10-21 NOTE — Telephone Encounter (Signed)
Pt states someone called her today, please call back.

## 2018-12-15 ENCOUNTER — Other Ambulatory Visit: Payer: Self-pay | Admitting: *Deleted

## 2018-12-15 MED ORDER — AMLODIPINE BESYLATE 5 MG PO TABS: 5.0000 mg | ORAL_TABLET | Freq: Every day | ORAL | 6 refills | Status: DC

## 2019-01-31 ENCOUNTER — Encounter: Payer: Self-pay | Admitting: Internal Medicine

## 2019-02-08 ENCOUNTER — Telehealth: Payer: Self-pay | Admitting: *Deleted

## 2019-02-08 ENCOUNTER — Other Ambulatory Visit: Payer: Self-pay | Admitting: Internal Medicine

## 2019-02-08 DIAGNOSIS — I10 Essential (primary) hypertension: Secondary | ICD-10-CM

## 2019-02-08 NOTE — Telephone Encounter (Addendum)
Received refill request from pt's pharmacy for simvastatin 20mg  tabs take one tablet by mouth every evening.  Medication no longer on list.  Will send to pcp for review.Despina Hidden Cassady3/4/20204:23 PM

## 2019-02-09 ENCOUNTER — Other Ambulatory Visit: Payer: Self-pay | Admitting: *Deleted

## 2019-02-09 DIAGNOSIS — K219 Gastro-esophageal reflux disease without esophagitis: Secondary | ICD-10-CM

## 2019-02-09 NOTE — Telephone Encounter (Signed)
Needs refill on omeprazole (PRILOSEC) 20 MG capsule [facility] [phone] - [place],  - [address] ;pt contact   [phone]

## 2019-02-09 NOTE — Telephone Encounter (Signed)
Requested refill of omeprazole in new encounter

## 2019-02-10 ENCOUNTER — Encounter: Payer: Self-pay | Admitting: Internal Medicine

## 2019-02-10 MED ORDER — OMEPRAZOLE 20 MG PO CPDR
DELAYED_RELEASE_CAPSULE | ORAL | 1 refills | Status: DC
Start: 2019-02-10 — End: 2020-07-31

## 2019-02-10 NOTE — Telephone Encounter (Signed)
Please call pt back about med. Please call pt back.

## 2019-02-13 NOTE — Telephone Encounter (Signed)
No answer

## 2019-02-20 ENCOUNTER — Other Ambulatory Visit: Payer: Self-pay | Admitting: Internal Medicine

## 2019-02-20 DIAGNOSIS — J302 Other seasonal allergic rhinitis: Secondary | ICD-10-CM

## 2019-02-20 DIAGNOSIS — M818 Other osteoporosis without current pathological fracture: Secondary | ICD-10-CM

## 2019-02-20 MED ORDER — CETIRIZINE HCL 10 MG PO TABS: 10.0000 mg | ORAL_TABLET | Freq: Every day | ORAL | 1 refills | Status: DC

## 2019-02-20 MED ORDER — CALCIUM CITRATE-VITAMIN D 250-100 MG-UNIT PO TABS: 1.0000 | ORAL_TABLET | Freq: Two times a day (BID) | ORAL | 5 refills | Status: DC

## 2019-02-20 NOTE — Telephone Encounter (Signed)
Patient not on a cholesterol med at present and Omeprazole #180 with 1 refill sent 02/10/2019. Call placed to patient. States she needs refill on calcium-vit D and cetirizine. Request sent to PCP. Hubbard Hartshorn, RN, BSN

## 2019-02-20 NOTE — Telephone Encounter (Signed)
Need refill on chol, heart burn,     [facility] [phone] - [place], [place] AT [address]     ;pt contact [phone]  Pls unsure the names of medicine

## 2019-02-23 NOTE — Telephone Encounter (Signed)
Needs refill on simvastatin 20mg  [facility] [phone] - [place], [place] ;pt contact [phone]

## 2019-02-25 ENCOUNTER — Other Ambulatory Visit: Payer: Self-pay | Admitting: Internal Medicine

## 2019-02-25 MED ORDER — SIMVASTATIN 20 MG PO TABS: 20.0000 mg | ORAL_TABLET | Freq: Every evening | ORAL | 0 refills | Status: DC

## 2019-02-25 NOTE — Telephone Encounter (Signed)
Sent simvastatin 20 mg QD to patient's pharmacy.

## 2019-04-06 ENCOUNTER — Other Ambulatory Visit: Payer: Self-pay

## 2019-04-06 ENCOUNTER — Telehealth: Payer: Self-pay | Admitting: Internal Medicine

## 2019-04-06 ENCOUNTER — Encounter: Payer: Medicaid Other | Admitting: Internal Medicine

## 2019-04-06 NOTE — Telephone Encounter (Signed)
Called patient x2- No answer

## 2019-04-07 ENCOUNTER — Encounter: Payer: Self-pay | Admitting: Internal Medicine

## 2019-05-19 ENCOUNTER — Other Ambulatory Visit: Payer: Self-pay | Admitting: *Deleted

## 2019-05-19 MED ORDER — AMLODIPINE BESYLATE 5 MG PO TABS: 5.0000 mg | ORAL_TABLET | Freq: Every day | ORAL | 6 refills | Status: DC

## 2019-05-24 ENCOUNTER — Other Ambulatory Visit: Payer: Self-pay | Admitting: *Deleted

## 2019-05-24 MED ORDER — AMLODIPINE BESYLATE 5 MG PO TABS: 5.0000 mg | ORAL_TABLET | Freq: Every day | ORAL | 6 refills | Status: DC

## 2019-05-24 NOTE — Telephone Encounter (Signed)
Fax from [employer] - need new rx from doctor affiliated with medicaid;stated Dr Isac Sarna is not a medicaid affiliated doctor. Thanks

## 2019-06-12 ENCOUNTER — Other Ambulatory Visit: Payer: Self-pay

## 2019-06-12 ENCOUNTER — Other Ambulatory Visit: Payer: Self-pay | Admitting: Internal Medicine

## 2019-06-12 ENCOUNTER — Ambulatory Visit: Payer: Medicaid Other | Admitting: Internal Medicine

## 2019-06-12 VITALS — BP 127/100 | HR 82 | Temp 98.1°F | Ht 63.0 in | Wt 111.7 lb

## 2019-06-12 DIAGNOSIS — I10 Essential (primary) hypertension: Secondary | ICD-10-CM

## 2019-06-12 DIAGNOSIS — E559 Vitamin D deficiency, unspecified: Secondary | ICD-10-CM

## 2019-06-12 DIAGNOSIS — K22719 Barrett's esophagus with dysplasia, unspecified: Secondary | ICD-10-CM

## 2019-06-12 DIAGNOSIS — Z79899 Other long term (current) drug therapy: Secondary | ICD-10-CM | POA: Diagnosis not present

## 2019-06-12 DIAGNOSIS — K219 Gastro-esophageal reflux disease without esophagitis: Secondary | ICD-10-CM

## 2019-06-12 DIAGNOSIS — Z8673 Personal history of transient ischemic attack (TIA), and cerebral infarction without residual deficits: Secondary | ICD-10-CM

## 2019-06-12 DIAGNOSIS — I6330 Cerebral infarction due to thrombosis of unspecified cerebral artery: Secondary | ICD-10-CM

## 2019-06-12 DIAGNOSIS — K2270 Barrett's esophagus without dysplasia: Secondary | ICD-10-CM | POA: Diagnosis not present

## 2019-06-12 MED ORDER — FAMOTIDINE 20 MG PO TABS: 20.0000 mg | ORAL_TABLET | Freq: Every day | ORAL | 2 refills | Status: DC

## 2019-06-12 MED ORDER — VITAMIN D (ERGOCALCIFEROL) 1.25 MG (50000 UNIT) PO CAPS: 50000.0000 [IU] | ORAL_CAPSULE | ORAL | 0 refills | Status: DC

## 2019-06-12 MED ORDER — AMLODIPINE BESYLATE 5 MG PO TABS: 5.0000 mg | ORAL_TABLET | Freq: Every day | ORAL | 3 refills | Status: DC

## 2019-06-12 NOTE — Patient Instructions (Addendum)
Ms. Burdett,   I send a prescription for pepcid to your pharmacy. Please continue taking your omeprazole as usual.   For your vitamin D deficiency, please take 1 capsule every day for the next 7 weeks.   I refilled your amlodipine.   Make a follow up appointment with me om 6 months or sooner if need to.   - Dr. Frederico Hamman

## 2019-06-14 ENCOUNTER — Encounter: Payer: Self-pay | Admitting: Internal Medicine

## 2019-06-14 NOTE — Assessment & Plan Note (Signed)
Last Vitamin D level 14.8. Currently on calcium supplementation, but not vitamin D. States she used to get supplements at the [facility] but they stopped selling them. Recommended starting vitamin D supplementation along with calcium due to history of osteoporosis.  - Vitamin D 50,000 units weekly - Repeat level in 3 months
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
## 2019-06-14 NOTE — Assessment & Plan Note (Signed)
Patient has a history of BArret's esophagus secondary to GERD. Previously on protonix which she self-discontinued after it made her feel bad. She presents today requesting famotidine as omeprazole makes her feel bloated. She denies symptoms of acid reflux, chest pain, dysphagia, odynophagia, and weight loss. She continues to decline surveillance EGD.  - Famotidine 20 mg QD  - Continue omeprazole 20 mg QD - Will continue to counseled on diet and importance of surveillance

## 2019-06-14 NOTE — Assessment & Plan Note (Signed)
History of CVA in 2007. Has not tolerate ASA in the past due to reflux. No history of bleeding. Offered Plavix for secondary prevention but she declined. Will continue to counsel in future visits.

## 2019-06-14 NOTE — Assessment & Plan Note (Signed)
Well controlled on clonidine 0.2 mg QD and amlodipine 5 mg QD. Will continue.

## 2019-06-16 NOTE — Progress Notes (Signed)
Internal Medicine Clinic Attending  Case discussed with Dr. Santos-Sanchez soon after the resident saw the patient.  We reviewed the resident's history and exam and pertinent patient test results.  I agree with the assessment, diagnosis, and plan of care documented in the resident's note.   

## 2019-06-23 ENCOUNTER — Other Ambulatory Visit: Payer: Self-pay | Admitting: *Deleted

## 2019-06-24 MED ORDER — SIMVASTATIN 20 MG PO TABS: 20.0000 mg | ORAL_TABLET | Freq: Every evening | ORAL | 0 refills | Status: DC

## 2019-07-28 ENCOUNTER — Other Ambulatory Visit: Payer: Self-pay | Admitting: *Deleted

## 2019-07-28 DIAGNOSIS — I10 Essential (primary) hypertension: Secondary | ICD-10-CM

## 2019-07-28 MED ORDER — CLONIDINE HCL 0.2 MG PO TABS: 0.2000 mg | ORAL_TABLET | Freq: Two times a day (BID) | ORAL | 1 refills | Status: DC

## 2019-07-28 NOTE — Telephone Encounter (Signed)
Note from pharmacy- [employer] states prescriber is not registered with  Medicaid.  Can alt MD authorize rx? [facility] is currently aware of this problem and working on resolving this issue.  Will send refill request to attending to authorize.Despina Hidden Cassady8/21/20209:50 AM

## 2019-08-28 ENCOUNTER — Telehealth: Payer: Self-pay

## 2019-08-28 NOTE — Telephone Encounter (Signed)
Returned phone call to patient about getting a brace for her hand,patient does have carpal tunnel syndrome. I told patient that we do not keep braces here at the clinic.The patient stated she got one from a hand specialist.I told the patient she could check back with that office,go to a medical supply company, or check with the drugstore.The last visit that she had for this was 2016.I told the patient I would be glade to check with her PCP to see if there was anything else we could do.

## 2019-08-28 NOTE — Telephone Encounter (Signed)
Requesting brace for the hand. Please call pt back.

## 2019-09-01 ENCOUNTER — Other Ambulatory Visit: Payer: Self-pay | Admitting: Internal Medicine

## 2019-09-01 ENCOUNTER — Other Ambulatory Visit: Payer: Self-pay

## 2019-09-01 DIAGNOSIS — J302 Other seasonal allergic rhinitis: Secondary | ICD-10-CM

## 2019-09-01 MED ORDER — SIMVASTATIN 20 MG PO TABS: 20.0000 mg | ORAL_TABLET | Freq: Every evening | ORAL | 0 refills | Status: DC

## 2019-09-01 MED ORDER — FLUTICASONE PROPIONATE 50 MCG/ACT NA SUSP: 2.0000 | Freq: Every day | NASAL | 1 refills | Status: DC

## 2019-09-01 MED ORDER — CETIRIZINE HCL 10 MG PO TABS: 10.0000 mg | ORAL_TABLET | Freq: Every day | ORAL | 1 refills | Status: DC

## 2019-09-01 NOTE — Telephone Encounter (Signed)
Refill Request- Patient states she does not need the fluticasone (FLONASE ALLERGY RELIEF) 50 MCG/ACT nasal spray  Pt is only needing the medications listed below to be called in.   simvastatin (ZOCOR) 20 MG tablet  cetirizine (ZYRTEC ALLERGY) 10 MG tablet  [id number] - [place], [place]

## 2019-09-01 NOTE — Telephone Encounter (Signed)
simvastatin (ZOCOR) 20 MG tablet,   fluticasone (FLONASE ALLERGY RELIEF) 50 MCG/ACT nasal spray   Refill request @  [facility] [phone] - [place], [place] AT [place] [phone] (Phone) [phone] (Fax)

## 2019-09-02 ENCOUNTER — Telehealth: Payer: Self-pay | Admitting: Internal Medicine

## 2019-09-02 DIAGNOSIS — E785 Hyperlipidemia, unspecified: Secondary | ICD-10-CM

## 2019-09-02 DIAGNOSIS — J302 Other seasonal allergic rhinitis: Secondary | ICD-10-CM

## 2019-09-02 MED ORDER — SIMVASTATIN 20 MG PO TABS: 20.0000 mg | ORAL_TABLET | Freq: Every evening | ORAL | 0 refills | Status: DC

## 2019-09-02 MED ORDER — CETIRIZINE HCL 10 MG PO TABS: 10.0000 mg | ORAL_TABLET | Freq: Every day | ORAL | 1 refills | Status: DC

## 2019-09-02 NOTE — Telephone Encounter (Signed)
   Reason for call:   I received a call from Ms. Keashia Hubers at 314 PM indicating that her medications, cetirizine and simvastatin prescriptions had not be filled.   Pertinent Data:   Per patient, she called the pharmacy who informed her that there was an issue with the prescriptions sent by the clinic. I informed the patient that Dr. Frederico Hamman had refilled these scripts on the 25th. However, the patient insisted that they were not available. I called the pharmacy but they did not answer in a timely manner and I was not able to continue to hold due to other urgent responsibilities. I refilled the scripts again and returned a call to the patient but there was no answer. I left a HIPPA approved message informing the patient to contact the pharmacy again.   Assessment / Plan / Recommendations:    As always, pt is advised that if symptoms worsen or new symptoms arise, they should go to an urgent care facility or to to ER for further evaluation.   Kathi Ludwig, MD   09/02/2019, 3:19 PM

## 2019-09-10 ENCOUNTER — Emergency Department (HOSPITAL_COMMUNITY): Payer: Medicaid Other

## 2019-09-10 ENCOUNTER — Emergency Department (HOSPITAL_COMMUNITY)
Admission: EM | Admit: 2019-09-10 | Discharge: 2019-09-10 | Disposition: A | Discharge status: Home / Self Care | Payer: Medicaid Other | Attending: Emergency Medicine | Admitting: Emergency Medicine

## 2019-09-10 ENCOUNTER — Other Ambulatory Visit: Payer: Self-pay

## 2019-09-10 ENCOUNTER — Encounter (HOSPITAL_COMMUNITY): Payer: Self-pay | Admitting: Emergency Medicine

## 2019-09-10 DIAGNOSIS — M25562 Pain in left knee: Secondary | ICD-10-CM | POA: Diagnosis not present

## 2019-09-10 DIAGNOSIS — R52 Pain, unspecified: Secondary | ICD-10-CM | POA: Diagnosis not present

## 2019-09-10 DIAGNOSIS — Y9389 Activity, other specified: Secondary | ICD-10-CM | POA: Insufficient documentation

## 2019-09-10 DIAGNOSIS — Y999 Unspecified external cause status: Secondary | ICD-10-CM | POA: Insufficient documentation

## 2019-09-10 DIAGNOSIS — I10 Essential (primary) hypertension: Secondary | ICD-10-CM | POA: Insufficient documentation

## 2019-09-10 DIAGNOSIS — M79642 Pain in left hand: Secondary | ICD-10-CM | POA: Diagnosis not present

## 2019-09-10 DIAGNOSIS — S6992XA Unspecified injury of left wrist, hand and finger(s), initial encounter: Secondary | ICD-10-CM | POA: Diagnosis not present

## 2019-09-10 DIAGNOSIS — M545 Low back pain: Secondary | ICD-10-CM | POA: Diagnosis not present

## 2019-09-10 DIAGNOSIS — M25532 Pain in left wrist: Secondary | ICD-10-CM | POA: Diagnosis not present

## 2019-09-10 DIAGNOSIS — Y92410 Unspecified street and highway as the place of occurrence of the external cause: Secondary | ICD-10-CM | POA: Diagnosis not present

## 2019-09-10 DIAGNOSIS — Z79899 Other long term (current) drug therapy: Secondary | ICD-10-CM | POA: Insufficient documentation

## 2019-09-10 DIAGNOSIS — F17210 Nicotine dependence, cigarettes, uncomplicated: Secondary | ICD-10-CM | POA: Insufficient documentation

## 2019-09-10 MED ORDER — METHOCARBAMOL 500 MG PO TABS: 500.0000 mg | ORAL_TABLET | Freq: Two times a day (BID) | ORAL | 0 refills | Status: DC

## 2019-09-10 MED ORDER — IBUPROFEN 200 MG PO TABS
400.0000 mg | ORAL_TABLET | Freq: Once | ORAL | Status: AC
  Administered 2019-09-10: 08:00:00 400 mg via ORAL
  Filled 2019-09-10: qty 2

## 2019-09-10 MED ORDER — DICLOFENAC SODIUM 1 % TD GEL: 2.0000 g | Freq: Four times a day (QID) | TRANSDERMAL | 1 refills | Status: DC
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
## 2019-09-10 NOTE — ED Notes (Signed)
EDP at bedside  

## 2019-09-10 NOTE — Discharge Instructions (Addendum)
Expect your soreness to increase over the next 2-3 days. Take it easy, but do not lay around too much as this may make any stiffness worse.  Antiinflammatory medications: Take 600 mg of ibuprofen every 6 hours or 440 mg (over the counter dose) to 500 mg (prescription dose) of naproxen every 12 hours for the next 3 days. After this time, these medications may be used as needed for pain. Take these medications with food to avoid upset stomach. Choose only one of these medications, do not take them together. Acetaminophen (generic for Tylenol): Should you continue to have additional pain while taking the ibuprofen or naproxen, you may add in acetaminophen as needed. Your daily total maximum amount of acetaminophen from all sources should be limited to 4000mg /day for persons without liver problems, or 2000mg /day for those with liver problems. Diclofenac gel: This is a topical anti-inflammatory medication and can be applied directly to the painful region.  Do not use on the face or genitals.  This medication may be used as an alternative to oral anti-inflammatory medications, such as ibuprofen or naproxen. Methocarbamol: Methocarbamol (generic for Robaxin) is a muscle relaxer and can help relieve stiff muscles or muscle spasms.  Do not drive or perform other dangerous activities while taking this medication as it can cause drowsiness as well as changes in reaction time and judgement. Lidocaine patches: These are available via either prescription or over-the-counter. The over-the-counter option may be more economical one and are likely just as effective. There are multiple over-the-counter brands, such as Salonpas. Exercises: Be sure to perform the attached exercises starting with three times a week and working up to performing them daily. This is an essential part of preventing long term problems.  Follow up: Follow up with a primary care provider for any future management of these complaints. Be sure to follow up  within 7-10 days. Return: Return to the ED should symptoms worsen.  For prescription assistance, may try using prescription discount sites or apps, such as goodrx.com  You have been seen today for a hand and wrist injury. There were no acute abnormalities on the x-rays, including no sign of fracture or dislocation, however, there could be injuries to the soft tissues, such as the ligaments or tendons that are not seen on xrays. There could also be what are called occult fractures that are small fractures not seen on xray. Ice: May apply ice to the area over the next 24 hours for 15 minutes at a time to reduce swelling. Elevation: Keep the extremity elevated as often as possible to reduce pain and inflammation. Support: Wear the splint for support and comfort. Wear this until pain resolves.  Follow up: If symptoms are improving, you may follow up with your primary care provider for any continued management. If symptoms are not starting to improve within a week, you should follow up with the orthopedic specialist within two weeks. Return: Return to the ED for numbness, weakness, increasing pain, overall worsening symptoms, loss of function, or if symptoms are not improving, you have tried to follow up with the orthopedic specialist, and have been unable to do so.  For prescription assistance, may try using prescription discount sites or apps, such as goodrx.com
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
## 2019-09-10 NOTE — ED Notes (Signed)
Pt ambulatory to bathroom

## 2019-09-10 NOTE — ED Notes (Signed)
Pt d/c home per MD order. Discharge summary reviewed, pt verbalizes understanding. Discharged home with visitor. Ambulatory off unit. No s/s of acute distress noted.

## 2019-09-10 NOTE — ED Notes (Signed)
Visitor at bedside.

## 2019-09-11 ENCOUNTER — Telehealth: Payer: Self-pay
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
## 2019-09-21 ENCOUNTER — Other Ambulatory Visit: Payer: Self-pay

## 2019-09-21 ENCOUNTER — Ambulatory Visit (INDEPENDENT_AMBULATORY_CARE_PROVIDER_SITE_OTHER): Payer: Self-pay | Admitting: Radiation Oncology

## 2019-09-21 VITALS — BP 131/79 | HR 72 | Ht 62.0 in | Wt 115.9 lb

## 2019-09-21 DIAGNOSIS — G8911 Acute pain due to trauma: Secondary | ICD-10-CM

## 2019-09-21 DIAGNOSIS — M1990 Unspecified osteoarthritis, unspecified site: Secondary | ICD-10-CM | POA: Insufficient documentation

## 2019-09-21 DIAGNOSIS — M25531 Pain in right wrist: Secondary | ICD-10-CM

## 2019-09-21 DIAGNOSIS — M25532 Pain in left wrist: Secondary | ICD-10-CM

## 2019-09-21 DIAGNOSIS — M25522 Pain in left elbow: Secondary | ICD-10-CM

## 2019-09-21 DIAGNOSIS — G5601 Carpal tunnel syndrome, right upper limb: Secondary | ICD-10-CM

## 2019-09-21 MED ORDER — DICLOFENAC SODIUM 1 % TD GEL: 2.0000 g | Freq: Four times a day (QID) | TRANSDERMAL | 1 refills | Status: DC
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
## 2019-09-21 NOTE — Patient Instructions (Addendum)
Thank you for coming to your appointment. It was so nice to see you. Today we discussed  Diagnoses and all orders for this visit:  Arthritis -     DG Hand Complete Right; Future - we will call you if the results are abnormal         -     Continue using diclofenac gel on your left and right hands and wrists   If labs were drawn today, I will call you with any abnormal results. Otherwise, please keep up the good work. I look forward to seeing you again soon at your follow up in 3 months.  Sincerely,  Al Decant, MD

## 2019-09-22 ENCOUNTER — Encounter: Payer: Self-pay | Admitting: Radiation Oncology
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
## 2019-09-22 NOTE — Progress Notes (Signed)
Internal Medicine Clinic Attending  I saw and evaluated the patient.  I personally confirmed the key portions of the history and exam documented by Dr. Lanier and I reviewed pertinent patient test results.  The assessment, diagnosis, and plan were formulated together and I agree with the documentation in the resident's note.   

## 2019-09-22 NOTE — Addendum Note (Signed)
Addended by: Lalla Brothers T on: 09/22/2019 02:23 PM   Modules accepted: Level of Service

## 2019-12-20 ENCOUNTER — Other Ambulatory Visit: Payer: Self-pay | Admitting: Internal Medicine

## 2019-12-20 DIAGNOSIS — E785 Hyperlipidemia, unspecified: Secondary | ICD-10-CM

## 2020-01-08 ENCOUNTER — Telehealth: Payer: Self-pay | Admitting: Internal Medicine

## 2020-01-08 NOTE — Telephone Encounter (Signed)
Called pt no answer.  Called [facility] - stated Dr Frederico Hamman- Laurin Coder is not medicaid approved for refill on Simvastatin and [place]. Attending for this am, Dr Rivka Safer NPI # given.  Pt called /informed.

## 2020-01-08 NOTE — Telephone Encounter (Signed)
Thank you Glenda... 

## 2020-01-08 NOTE — Telephone Encounter (Signed)
Pls contact pt regarding medicine, pharmacy need dr Bonnita Nasuti approve [phone]

## 2020-03-29 ENCOUNTER — Encounter: Payer: Self-pay | Admitting: *Deleted

## 2020-05-22 ENCOUNTER — Other Ambulatory Visit: Payer: Self-pay

## 2020-05-22 DIAGNOSIS — E785 Hyperlipidemia, unspecified: Secondary | ICD-10-CM

## 2020-05-23 MED ORDER — SIMVASTATIN 20 MG PO TABS: ORAL_TABLET | ORAL | 3 refills | Status: DC

## 2020-05-23 NOTE — Telephone Encounter (Signed)
Refill Request-Pt very upset that her pharmacy will not accept her Dr.'s  Script with Medicaid. Please call this patient back.   simvastatin (ZOCOR) 20 MG tablet  [id number] - [place], [place]

## 2020-05-23 NOTE — Telephone Encounter (Signed)
Refill is appropriate.Please send to attending pool as I am unable to refill meds for Medicaid patients.

## 2020-07-31 ENCOUNTER — Other Ambulatory Visit: Payer: Self-pay | Admitting: *Deleted

## 2020-07-31 DIAGNOSIS — K2270 Barrett's esophagus without dysplasia: Secondary | ICD-10-CM

## 2020-07-31 MED ORDER — OMEPRAZOLE 20 MG PO CPDR: DELAYED_RELEASE_CAPSULE | ORAL | 1 refills | Status: DC

## 2020-12-12 ENCOUNTER — Other Ambulatory Visit: Payer: Self-pay

## 2020-12-12 ENCOUNTER — Encounter: Payer: Self-pay | Admitting: Student

## 2020-12-12 ENCOUNTER — Ambulatory Visit (INDEPENDENT_AMBULATORY_CARE_PROVIDER_SITE_OTHER): Payer: Medicaid Other | Admitting: Student

## 2020-12-12 VITALS — BP 122/80 | HR 75 | Temp 98.2°F | Ht 63.0 in | Wt 111.3 lb

## 2020-12-12 DIAGNOSIS — G5603 Carpal tunnel syndrome, bilateral upper limbs: Secondary | ICD-10-CM | POA: Diagnosis not present

## 2020-12-12 DIAGNOSIS — I10 Essential (primary) hypertension: Secondary | ICD-10-CM | POA: Diagnosis not present

## 2020-12-12 DIAGNOSIS — M1990 Unspecified osteoarthritis, unspecified site: Secondary | ICD-10-CM

## 2020-12-12 DIAGNOSIS — K2270 Barrett's esophagus without dysplasia: Secondary | ICD-10-CM | POA: Diagnosis not present

## 2020-12-12 DIAGNOSIS — K22719 Barrett's esophagus with dysplasia, unspecified: Secondary | ICD-10-CM | POA: Diagnosis not present

## 2020-12-12 DIAGNOSIS — E785 Hyperlipidemia, unspecified: Secondary | ICD-10-CM

## 2020-12-12 DIAGNOSIS — J302 Other seasonal allergic rhinitis: Secondary | ICD-10-CM | POA: Diagnosis not present

## 2020-12-12 DIAGNOSIS — E559 Vitamin D deficiency, unspecified: Secondary | ICD-10-CM | POA: Diagnosis not present

## 2020-12-12 DIAGNOSIS — M2540 Effusion, unspecified joint: Secondary | ICD-10-CM | POA: Diagnosis not present

## 2020-12-12 DIAGNOSIS — R5383 Other fatigue: Secondary | ICD-10-CM | POA: Diagnosis not present

## 2020-12-12 DIAGNOSIS — J3089 Other allergic rhinitis: Secondary | ICD-10-CM

## 2020-12-12 DIAGNOSIS — M818 Other osteoporosis without current pathological fracture: Secondary | ICD-10-CM | POA: Diagnosis not present

## 2020-12-12 MED ORDER — CALCIUM CITRATE-VITAMIN D 250-100 MG-UNIT PO TABS: 1.0000 | ORAL_TABLET | Freq: Two times a day (BID) | ORAL | 5 refills | Status: DC

## 2020-12-12 MED ORDER — FLUTICASONE PROPIONATE 50 MCG/ACT NA SUSP: 2.0000 | Freq: Every day | NASAL | 1 refills | Status: DC

## 2020-12-12 MED ORDER — OMEPRAZOLE 20 MG PO CPDR: DELAYED_RELEASE_CAPSULE | ORAL | 1 refills | Status: DC

## 2020-12-12 MED ORDER — AMLODIPINE BESYLATE 5 MG PO TABS: 5.0000 mg | ORAL_TABLET | Freq: Every day | ORAL | 3 refills | Status: DC

## 2020-12-12 MED ORDER — FAMOTIDINE 20 MG PO TABS: 20.0000 mg | ORAL_TABLET | Freq: Every day | ORAL | 2 refills | Status: DC

## 2020-12-12 MED ORDER — SIMVASTATIN 20 MG PO TABS: ORAL_TABLET | ORAL | 3 refills | Status: DC

## 2020-12-12 MED ORDER — CLONIDINE HCL 0.2 MG PO TABS: 0.2000 mg | ORAL_TABLET | Freq: Two times a day (BID) | ORAL | 2 refills | Status: DC

## 2020-12-12 NOTE — Assessment & Plan Note (Signed)
A: Patient has been referred to orthopedic surgery in the past for muscle wasting and weak R hand grip in the past, with difficulty wearing splints. Attributed pain and numbness to carpal tunnel syndrome, although unclear history documented in chart review. Patient has negative Phalen and Tinel's sign on examination today. Hx more consistent with arthritis vs. Neuropathy.   P:  - Will check XR of R hand before advising splint use

## 2020-12-12 NOTE — Patient Instructions (Addendum)
Brittany Burns,  Today, we discussed that you can stop taking Zyrtec and instead try taking over the counter Allegra or Xyzal for your allergies, as needed. There may be other reasons you are feeling tired, although it would be difficult to assess these without blood work today. You may benefit from future lab appointment if you are able to build towards this.   For your pain and tingling in your fingers, I have ordered an Xray. You will receive a call to schedule this. This xray will help tell whether your hand pain may be to rheumatoid arthritis, which has different treatments than osteoarthritis.   I have refilled all of your medications to your pharmacy. Some may be cheaper over the counter. Please discuss with your pharmacist and call [phone] with any symptoms or concerns.   Otherwise, we will see you in about 6 months.   Thank you and take care,  Dr. Laddie Aquas  Arthritis Arthritis means joint pain. It can also mean joint disease. A joint is a place where bones come together. There are more than 100 types of arthritis. What are the causes? This condition may be caused by:  Wear and tear of a joint. This is the most common cause.  A lot of acid in the blood, which leads to pain in the joint (gout).  Pain and swelling (inflammation) in a joint.  Infection of a joint.  Injuries in the joint.  A reaction to medicines (allergy). In some cases, the cause may not be known. What are the signs or symptoms? Symptoms of this condition include:  Redness at a joint.  Swelling at a joint.  Stiffness at a joint.  Warmth coming from the joint.  A fever.  A feeling of being sick. How is this treated? This condition may be treated with:  Treating the cause, if it is known.  Rest.  Raising (elevating) the joint.  Putting cold or hot packs on the joint.  Medicines to treat symptoms and reduce pain and swelling.  Shots of medicines (cortisone) into the joint. You may also be  told to make changes in your life, such as doing exercises and losing weight. Follow these instructions at home: Medicines  Take over-the-counter and prescription medicines only as told by your doctor.  Do not take aspirin for pain if your doctor says that you may have gout. Activity  Rest your joint if your doctor tells you to.  Avoid activities that make the pain worse.  Exercise your joint regularly as told by your doctor. Try doing exercises like: ? Swimming. ? Water aerobics. ? Biking. ? Walking. Managing pain, stiffness, and swelling      If told, put ice on the affected area. ? Put ice in a plastic bag. ? Place a towel between your skin and the bag. ? Leave the ice on for 20 minutes, 2-3 times per day.  If your joint is swollen, raise (elevate) it above the level of your heart if told by your doctor.  If your joint feels stiff in the morning, try taking a warm shower.  If told, put heat on the affected area. Do this as often as told by your doctor. Use the heat source that your doctor recommends, such as a moist heat pack or a heating pad. If you have diabetes, do not apply heat without asking your doctor. To apply heat: ? Place a towel between your skin and the heat source. ? Leave the heat on for 20-30 minutes. ?  Remove the heat if your skin turns bright red. This is very important if you are unable to feel pain, heat, or cold. You may have a greater risk of getting burned. General instructions  Do not use any products that contain nicotine or tobacco, such as cigarettes, e-cigarettes, and chewing tobacco. If you need help quitting, ask your doctor.  Keep all follow-up visits as told by your doctor. This is important. Contact a doctor if:  The pain gets worse.  You have a fever. Get help right away if:  You have very bad pain in your joint.  You have swelling in your joint.  Your joint is red.  Many joints become painful and swollen.  You have very bad  back pain.  Your leg is very weak.  You cannot control your pee (urine) or poop (stool). Summary  Arthritis means joint pain. It can also mean joint disease. A joint is a place where bones come together.  The most common cause of this condition is wear and tear of a joint.  Symptoms of this condition include redness, swelling, or stiffness of the joint.  This condition is treated with rest, raising the joint, medicines, and putting cold or hot packs on the joint.  Follow your doctor's instructions about medicines, activity, exercises, and other home care treatments. This information is not intended to replace advice given to you by your health care provider. Make sure you discuss any questions you have with your health care provider. Document Revised: 10/31/2018 Document Reviewed: 10/31/2018 Elsevier Patient Education  2020 [employer].

## 2020-12-12 NOTE — Assessment & Plan Note (Signed)
A/P: See "fatigue"

## 2020-12-12 NOTE — Assessment & Plan Note (Signed)
A: Refuses repeat lipid panel today.   P: Continue simvastatin 20mg  daily for now

## 2020-12-12 NOTE — Assessment & Plan Note (Signed)
A: Blood pressure well controlled today at 122/80 on amlodipine and clinidine, which patient states she continues to take regularly.   P:  - Continue amlodipine 5mg  daily - Continue clonidine 0.2mg  twice daily

## 2020-12-12 NOTE — Assessment & Plan Note (Signed)
A: Patient has history of Barrett's esophagus 2/2 GERD.   P: Continue famotidine and omeprazole 20mg  daily
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
## 2020-12-12 NOTE — Assessment & Plan Note (Signed)
-   Continue calcium-vitamin D supplements  - Patient states she no longer takes vitamin D 50,000 units weekly; encouraged continued vitamin D 1000 units daily  - Refused repeat labwork
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
## 2020-12-16 NOTE — Progress Notes (Signed)
[facility] Medicine Clinic Attending  Case discussed with Dr. Speakman  At the time of the visit.  We reviewed the resident's history and exam and pertinent patient test results.  I agree with the assessment, diagnosis, and plan of care documented in the resident's note.  

## 2020-12-24 ENCOUNTER — Encounter (HOSPITAL_COMMUNITY): Payer: Self-pay | Admitting: *Deleted

## 2020-12-24 ENCOUNTER — Other Ambulatory Visit: Payer: Self-pay

## 2020-12-24 ENCOUNTER — Observation Stay (HOSPITAL_COMMUNITY)
Admission: EM | Admit: 2020-12-24 | Discharge: 2020-12-26 | Disposition: A | Discharge status: Home / Self Care | Payer: Medicaid Other | Attending: Internal Medicine | Admitting: Internal Medicine

## 2020-12-24 DIAGNOSIS — F17210 Nicotine dependence, cigarettes, uncomplicated: Secondary | ICD-10-CM | POA: Diagnosis not present

## 2020-12-24 DIAGNOSIS — J984 Other disorders of lung: Secondary | ICD-10-CM | POA: Diagnosis not present

## 2020-12-24 DIAGNOSIS — U071 COVID-19: Secondary | ICD-10-CM | POA: Diagnosis not present

## 2020-12-24 DIAGNOSIS — I700 Atherosclerosis of aorta: Secondary | ICD-10-CM | POA: Diagnosis not present

## 2020-12-24 DIAGNOSIS — Z79899 Other long term (current) drug therapy: Secondary | ICD-10-CM | POA: Insufficient documentation

## 2020-12-24 DIAGNOSIS — R9431 Abnormal electrocardiogram [ECG] [EKG]: Secondary | ICD-10-CM | POA: Diagnosis present

## 2020-12-24 DIAGNOSIS — R918 Other nonspecific abnormal finding of lung field: Secondary | ICD-10-CM | POA: Diagnosis not present

## 2020-12-24 DIAGNOSIS — I10 Essential (primary) hypertension: Secondary | ICD-10-CM | POA: Insufficient documentation

## 2020-12-24 DIAGNOSIS — R821 Myoglobinuria: Secondary | ICD-10-CM | POA: Insufficient documentation

## 2020-12-24 DIAGNOSIS — R059 Cough, unspecified: Secondary | ICD-10-CM | POA: Diagnosis present

## 2020-12-24 DIAGNOSIS — N179 Acute kidney failure, unspecified: Secondary | ICD-10-CM | POA: Diagnosis not present

## 2020-12-24 DIAGNOSIS — I517 Cardiomegaly: Secondary | ICD-10-CM | POA: Diagnosis not present

## 2020-12-24 DIAGNOSIS — J1282 Pneumonia due to coronavirus disease 2019: Secondary | ICD-10-CM | POA: Diagnosis not present

## 2020-12-24 DIAGNOSIS — I4581 Long QT syndrome: Secondary | ICD-10-CM | POA: Diagnosis not present

## 2020-12-24 LAB — RESP PANEL BY RT-PCR (FLU A&B, COVID) ARPGX2
Influenza A by PCR: NEGATIVE
Influenza B by PCR: NEGATIVE
SARS Coronavirus 2 by RT PCR: POSITIVE — AB

## 2020-12-24 NOTE — ED Triage Notes (Signed)
Pt reports not feeling well since last week. Has headache, cough, lack of appetite. Airway intact and no resp distress noted.

## 2020-12-25 ENCOUNTER — Emergency Department (HOSPITAL_COMMUNITY): Payer: Medicaid Other

## 2020-12-25 DIAGNOSIS — U071 COVID-19: Secondary | ICD-10-CM

## 2020-12-25 DIAGNOSIS — I517 Cardiomegaly: Secondary | ICD-10-CM | POA: Diagnosis not present

## 2020-12-25 DIAGNOSIS — R918 Other nonspecific abnormal finding of lung field: Secondary | ICD-10-CM | POA: Diagnosis not present

## 2020-12-25 DIAGNOSIS — N179 Acute kidney failure, unspecified: Secondary | ICD-10-CM | POA: Diagnosis present

## 2020-12-25 DIAGNOSIS — R9431 Abnormal electrocardiogram [ECG] [EKG]: Secondary | ICD-10-CM | POA: Diagnosis present

## 2020-12-25 DIAGNOSIS — I700 Atherosclerosis of aorta: Secondary | ICD-10-CM | POA: Diagnosis not present

## 2020-12-25 DIAGNOSIS — J984 Other disorders of lung: Secondary | ICD-10-CM | POA: Diagnosis not present

## 2020-12-25 LAB — BASIC METABOLIC PANEL
Anion gap: 13 (ref 5–15)
BUN: 28 mg/dL — ABNORMAL HIGH (ref 8–23)
CO2: 16 mmol/L — ABNORMAL LOW (ref 22–32)
Calcium: 8.4 mg/dL — ABNORMAL LOW (ref 8.9–10.3)
Chloride: 111 mmol/L (ref 98–111)
Creatinine, Ser: 1.34 mg/dL — ABNORMAL HIGH (ref 0.44–1.00)
GFR, Estimated: 45 mL/min — ABNORMAL LOW (ref >60)
Glucose, Bld: 85 mg/dL (ref 70–99)
Potassium: 3.6 mmol/L (ref 3.5–5.1)
Sodium: 140 mmol/L (ref 135–145)

## 2020-12-25 LAB — URINALYSIS, COMPLETE (UACMP) WITH MICROSCOPIC
Bilirubin Urine: NEGATIVE
Glucose, UA: 50 mg/dL — AB
Ketones, ur: 5 mg/dL — AB
Leukocytes,Ua: NEGATIVE
Nitrite: NEGATIVE
Protein, ur: 100 mg/dL — AB
Specific Gravity, Urine: 1.012 (ref 1.005–1.030)
pH: 5 (ref 5.0–8.0)

## 2020-12-25 LAB — COMPREHENSIVE METABOLIC PANEL
ALT: 15 U/L (ref 0–44)
AST: 33 U/L (ref 15–41)
Albumin: 3.8 g/dL (ref 3.5–5.0)
Alkaline Phosphatase: 71 U/L (ref 38–126)
Anion gap: 17 — ABNORMAL HIGH (ref 5–15)
BUN: 41 mg/dL — ABNORMAL HIGH (ref 8–23)
CO2: 13 mmol/L — ABNORMAL LOW (ref 22–32)
Calcium: 8.3 mg/dL — ABNORMAL LOW (ref 8.9–10.3)
Chloride: 104 mmol/L (ref 98–111)
Creatinine, Ser: 2.85 mg/dL — ABNORMAL HIGH (ref 0.44–1.00)
GFR, Estimated: 18 mL/min — ABNORMAL LOW (ref >60)
Glucose, Bld: 99 mg/dL (ref 70–99)
Potassium: 4.7 mmol/L (ref 3.5–5.1)
Sodium: 134 mmol/L — ABNORMAL LOW (ref 135–145)
Total Bilirubin: 0.9 mg/dL (ref 0.3–1.2)
Total Protein: 7.1 g/dL (ref 6.5–8.1)

## 2020-12-25 LAB — CBC WITH DIFFERENTIAL/PLATELET
Abs Immature Granulocytes: 0.02 10*3/uL (ref 0.00–0.07)
Basophils Absolute: 0 10*3/uL (ref 0.0–0.1)
Basophils Relative: 1 %
Eosinophils Absolute: 0 10*3/uL (ref 0.0–0.5)
Eosinophils Relative: 0 %
HCT: 46.1 % — ABNORMAL HIGH (ref 36.0–46.0)
Hemoglobin: 16 g/dL — ABNORMAL HIGH (ref 12.0–15.0)
Immature Granulocytes: 1 %
Lymphocytes Relative: 34 %
Lymphs Abs: 1.2 10*3/uL (ref 0.7–4.0)
MCH: 32.1 pg (ref 26.0–34.0)
MCHC: 34.7 g/dL (ref 30.0–36.0)
MCV: 92.6 fL (ref 80.0–100.0)
Monocytes Absolute: 0.2 10*3/uL (ref 0.1–1.0)
Monocytes Relative: 5 %
Neutro Abs: 2.2 10*3/uL (ref 1.7–7.7)
Neutrophils Relative %: 59 %
Platelets: 206 10*3/uL (ref 150–400)
RBC: 4.98 MIL/uL (ref 3.87–5.11)
RDW: 14.1 % (ref 11.5–15.5)
WBC: 3.6 10*3/uL — ABNORMAL LOW (ref 4.0–10.5)
nRBC: 0 % (ref 0.0–0.2)

## 2020-12-25 LAB — D-DIMER, QUANTITATIVE: D-Dimer, Quant: 0.76 ug/mL-FEU — ABNORMAL HIGH (ref 0.00–0.50)

## 2020-12-25 LAB — LACTIC ACID, PLASMA: Lactic Acid, Venous: 0.9 mmol/L (ref 0.5–1.9)

## 2020-12-25 LAB — HIV ANTIBODY (ROUTINE TESTING W REFLEX): HIV Screen 4th Generation wRfx: NONREACTIVE

## 2020-12-25 LAB — SODIUM, URINE, RANDOM: Sodium, Ur: 58 mmol/L

## 2020-12-25 LAB — LIPASE, BLOOD: Lipase: 48 U/L (ref 11–51)

## 2020-12-25 LAB — MAGNESIUM: Magnesium: 2.6 mg/dL — ABNORMAL HIGH (ref 1.7–2.4)

## 2020-12-25 LAB — CREATININE, URINE, RANDOM: Creatinine, Urine: 62.2 mg/dL

## 2020-12-25 LAB — FIBRINOGEN: Fibrinogen: 506 mg/dL — ABNORMAL HIGH (ref 210–475)

## 2020-12-25 LAB — FERRITIN: Ferritin: 627 ng/mL — ABNORMAL HIGH (ref 11–307)

## 2020-12-25 LAB — C-REACTIVE PROTEIN: CRP: 1.3 mg/dL — ABNORMAL HIGH (ref <1.0)

## 2020-12-25 MED ORDER — SODIUM CHLORIDE 0.9% FLUSH
3.0000 mL | Freq: Two times a day (BID) | INTRAVENOUS | Status: DC
  Administered 2020-12-25 – 2020-12-26 (×2): 3 mL via INTRAVENOUS

## 2020-12-25 MED ORDER — ACETAMINOPHEN 325 MG PO TABS: 650.0000 mg | ORAL_TABLET | Freq: Four times a day (QID) | ORAL | Status: DC | PRN

## 2020-12-25 MED ORDER — POLYETHYLENE GLYCOL 3350 17 G PO PACK: 17.0000 g | PACK | Freq: Every day | ORAL | Status: DC | PRN

## 2020-12-25 MED ORDER — FAMOTIDINE 20 MG PO TABS: 20.0000 mg | ORAL_TABLET | Freq: Every day | ORAL | Status: DC

## 2020-12-25 MED ORDER — LACTATED RINGERS IV BOLUS
1000.0000 mL | Freq: Once | INTRAVENOUS | Status: AC
  Administered 2020-12-25: 1000 mL via INTRAVENOUS

## 2020-12-25 MED ORDER — ONDANSETRON 4 MG PO TBDP: 4.0000 mg | ORAL_TABLET | Freq: Once | ORAL | Status: DC

## 2020-12-25 MED ORDER — LACTATED RINGERS IV SOLN: INTRAVENOUS | Status: AC

## 2020-12-25 MED ORDER — LACTATED RINGERS IV BOLUS
500.0000 mL | Freq: Once | INTRAVENOUS | Status: AC
  Administered 2020-12-25: 500 mL via INTRAVENOUS

## 2020-12-25 MED ORDER — PANTOPRAZOLE SODIUM 40 MG PO TBEC
40.0000 mg | DELAYED_RELEASE_TABLET | Freq: Every day | ORAL | Status: DC
  Administered 2020-12-25 – 2020-12-26 (×2): 40 mg via ORAL
  Filled 2020-12-25 (×2): qty 1

## 2020-12-25 MED ORDER — ADULT MULTIVITAMIN W/MINERALS CH
1.0000 | ORAL_TABLET | Freq: Every day | ORAL | Status: DC
  Administered 2020-12-25 – 2020-12-26 (×2): 1 via ORAL
  Filled 2020-12-25 (×2): qty 1

## 2020-12-25 MED ORDER — SIMVASTATIN 20 MG PO TABS: 20.0000 mg | ORAL_TABLET | Freq: Every day | ORAL | Status: DC

## 2020-12-25 MED ORDER — ACETAMINOPHEN 500 MG PO TABS
1000.0000 mg | ORAL_TABLET | Freq: Once | ORAL | Status: AC
  Administered 2020-12-25: 1000 mg via ORAL
  Filled 2020-12-25: qty 2

## 2020-12-25 MED ORDER — HEPARIN SODIUM (PORCINE) 5000 UNIT/ML IJ SOLN: 5000.0000 [IU] | Freq: Three times a day (TID) | INTRAMUSCULAR | Status: DC

## 2020-12-25 MED ORDER — ALUM & MAG HYDROXIDE-SIMETH 200-200-20 MG/5ML PO SUSP
30.0000 mL | Freq: Once | ORAL | Status: AC
  Administered 2020-12-25: 30 mL via ORAL
  Filled 2020-12-25: qty 30

## 2020-12-25 NOTE — Discharge Instructions (Signed)
IMPRESSION:  1. Hazy bilateral airspace opacities consistent with multifocal  pneumonia (viral or bacterial).  2. Rounded density in the right lower lobe may represent a nipple  shadow. A 4-6 week follow-up two-view chest x-ray is recommended to  confirm stability or resolution of this finding.
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
## 2020-12-25 NOTE — ED Notes (Signed)
Lunch Tray Ordered @ 1019.

## 2020-12-25 NOTE — Progress Notes (Addendum)
Subjective:   Patient states that she is feeling well. Had pain in her R hip and other muscles earlier, but these have improved. Did not eat much because her food was cold, but feels that she can eat if it were more appetizing. States that she was drinking 3 large bottles of water a day at home, but was having ~3 episodes of diarrhea daily.  Objective:  Vital signs in last 24 hours: Vitals:   12/25/20 1000 12/25/20 1100 12/25/20 1145 12/25/20 1200  BP: 129/78 129/86 129/86 124/88  Pulse: 82 80 79 81  Resp: 20 (!) 21 (!) 23 16  Temp:      TempSrc:      SpO2: 99% 97% 97% 96%  Weight:      Height:        Physical Exam Constitutional: no acute distress Head: atraumatic ENT: external ears normal Eyes: EOMI Cardiovascular: regular rate and rhythm, normal heart sounds, pulses 2+ throughout and extremities well perfused Pulmonary: effort normal, lungs clear to ascultation bilaterally Abdominal: flat Skin: warm and dry Neurological: alert, no focal deficit Psychiatric: normal mood and affect  Assessment/Plan: Brittany Burns is a 65 y.o. female with hx of CVA, HTN, HLD, tobacco use disorder, and Barrett's esophagus presenting with COVID-19 infection with poor appetite and AKI.  Active Problems:   Prolonged Q-T interval on ECG   Acute renal failure ([place])   COVID-19 pneumonia Began having COVID-19 symptoms about 6 days ago with exposure about 6-8 days ago. No respiratory symptoms at this time, but CXR with pneumonia. CBC showing leukopenia at 3.6. No oxygen requirement. Is complaining of loss of appetite and myalgias primarily in R hip.  -consider starting decadron if patient becomes hypoxic or develops any respiratory distress -airborne and contact precautions -trending inflammatory markers -tylenol prn for fevers, headache, mild pain  AKI Abnormal urine studies Cr 2.85 on admission. Baseline ~0.7-0.9. FENa of 2% but this was after hydration was started. Suspect prerenal  etiology from dehydration 2/2 lack of appetite, diarrhea, and insensible loss from fevers. Endorsed using ibupronfe 800mg  once daily for the past week, no other clear nephrotoxics. On maintenance fluids. UA with moderate myoglobinuria, mild ketonuria, and proteinuria which appear to be chronic. UA also with WBC clumps -maintenance LR -repeat BMP at 1200 -avoid nephrotoxic medications -monitor UOP, strict I/O's -f/u urine sodium -f/u urine culture -f/u urine studies outpatient  Anion Gap Metabolic Acidosis CMP showing bicarb 13 and AG 17. Lactate pending. BUN 41, so could possibly be from uremia or starvation ketosis given poor PO intake.  -repeat BMP at 1200 -LR maintenance fluids -f/u lactate  QT prolongation QTc 543ms -Avoid QT prolonging medications -discontinue famotidine as patient is not using it regularly  CVA (2007) In left periventricular area, noted on MRI in 04/2006. No residual deficits. Did not tolerate ASA due to reflux. Was offered plavix, but declined. -continue ASA  HTN HLD On norvasc 5mg  daily and clonidine 0.2mg  BID at home. On zocor 20mg  qhs at home. -holding home antihypertensives due to soft BP on admission, now normotensive -can restart home clonidine once BP improves to avoid rebound HTN  GERD Chronic cough On pepcid 20mg  daily and omeprazole 20mg  daily at home, but states she has only been using the omeprazole and only occasionally -stop pepcid due to renal function and lack of use -continue protonix  Diet:  regular IVF:  LR VTE:  heparin Prior to Admission Living Arrangement:  home Anticipated Discharge Location:  home Barriers to Discharge:  AKI Dispo: Anticipated discharge in approximately 0-1 day(s).   Andrew Au, MD 12/25/2020, 12:54 PM Pager: [phone] After 5pm on weekdays and 1pm on weekends: On Call pager [phone]

## 2020-12-25 NOTE — ED Notes (Signed)
Pt refused this rn to draw blood. States she only wants a Engineer, site paged again.

## 2020-12-25 NOTE — ED Notes (Signed)
Pt up to bedside commode

## 2020-12-25 NOTE — ED Notes (Signed)
Pt yelling at staff bc she is cold. Pt given multiple blankets.
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
## 2020-12-25 NOTE — ED Notes (Signed)
Phlebotomist called for IV stick.

## 2020-12-25 NOTE — Discharge Summary (Signed)
Name: Brittany Burns MRN: [id number] DOB: 12/07/1956 65 y.o. PCP: Jeralyn Bennett, MD  Date of Admission: 12/24/2020  6:00 PM Date of Discharge:  12/26/2020 Attending Physician: Angelica Pou, MD   Discharge Diagnosis: 1. AKI 2. COVID-19 pneumonia 3. Chronic myoglobinuria  Discharge Medications: Allergies as of 12/26/2020   No Known Allergies     Medication List    STOP taking these medications   amLODipine 5 MG tablet Commonly known as: NORVASC   diclofenac sodium 1 % Gel Commonly known as: VOLTAREN   famotidine 20 MG tablet Commonly known as: Pepcid   methocarbamol 500 MG tablet Commonly known as: ROBAXIN     TAKE these medications   calcium-vitamin D 250-100 MG-UNIT tablet Take 1 tablet by mouth 2 (two) times daily.   cloNIDine 0.2 MG tablet Commonly known as: CATAPRES Take 0.5 tablets (0.1 mg total) by mouth 2 (two) times daily. What changed: how much to take   fluticasone 50 MCG/ACT nasal spray Commonly known as: Flonase Allergy Relief Place 2 sprays into both nostrils daily.   omeprazole 20 MG capsule Commonly known as: PRILOSEC take 1 capsule BY MOUTH EVERY DAY 30 minutes before lunch/dinner   simvastatin 20 MG tablet Commonly known as: ZOCOR TAKE 1 TABLET(20 MG) BY MOUTH EVERY EVENING What changed:   how much to take  how to take this  when to take this  additional instructions       Disposition and follow-up:   Ms.Tatijana Kasparek was discharged from [facility] in Good condition.  At the hospital follow up visit please address:  1.  Follow up: . AKI- repeat creatinine to ensure resolution . Hypertension- Home meds of Norvasc 5 mg daily and clonidine 0.2 mg twice daily.  At discharge stopped Norvasc and reduce clonidine to 0.1 mg as she was hypotensive on arrival.  May resume as needed, though consider an alternative to clonidine . Round density on chest x-ray in a smoker- incidentally found round density in  right lower lobe which could represent nipple shadow.  Repeat chest x-ray in 4 to 6 weeks. . Chronic myoglobinuria- not convincing UA for a UTI, but noted large hemoglobin with negative red cells, clumps of white blood cells, and proteinuria.  These have been persistent for several years. Could also be an element of rhabdomyolysis given her dehydration this admission. Follow-up urine culture and repeat UA . Barrett's esophagus - last EGD in 2015, it is noted that she has declined surveillance in the past, now having less GERD symptoms so she has been taking protonix and pepcid only occasionally, have d/ced her pepcid. On followup, offer further surveillance EGD . 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 Osteoporosis - explore why she is not treated with bisphosphonate, collect vitamin D level  2.  Labs / imaging needed at time of follow-up: BMP, vitamin D, chest x-ray, UA with microscopic  3.  Pending labs/ test needing follow-up: Urine culture  Follow-up Appointments:   Hospital Course by problem list:  AKI Presented with creatinine of 2.85 which improved to 1.34 after hydration.  FENa was 2%, but was collected after starting hydration.  Most likely prerenal etiology from dehydration secondary to lack of appetite, diarrhea, and insensible losses from fever.  Patient was tolerating p.o. well at discharge counseled on continuing intake and hydration at home.  COVID-19 pneumonia Began having myalgias and poor appetite about 6 days ago with exposure about 6-8 days ago.  No respiratory symptoms at this time, but CXR with pneumonia.  No oxygen requirement.  Has had low appetite and diarrhea, which likely caused her above AKI.  No indication for specific COVID treatment.  Lung density on xray CXR incidentally found a rounded density in the R lower lobe, which could represent nipple shadow. She is an active smoker. Not present on prior XR. F/u CXR in  4-6 weeks.  Discharge Vitals:   BP 121/76 (BP Location: Left Arm)   Pulse (!) 101   Temp 98.6 F (37 C) (Oral)   Resp 20   Ht 5\' 3"  (1.6 m)   Wt 50.3 kg   SpO2 93%   BMI 19.66 kg/m   Pertinent Labs, Studies, and Procedures:  DG Chest Portable 1 View  Result Date: 12/25/2020 CLINICAL DATA:  COVID. EXAM: PORTABLE CHEST 1 VIEW COMPARISON:  08/27/2017 FINDINGS: The heart size is enlarged. There are hazy bilateral airspace opacities. There is a rounded density in the right lower lobe. There is no pneumothorax. No large pleural effusion. Aortic calcifications are noted. IMPRESSION: 1. Hazy bilateral airspace opacities consistent with multifocal pneumonia (viral or bacterial). 2. Rounded density in the right lower lobe may represent a nipple shadow. A 4-6 week follow-up two-view chest x-ray is recommended to confirm stability or resolution of this finding. Electronically Signed   By: Constance Holster M.D.   On: 12/25/2020 02:40    Recent Labs    12/25/20 0320 12/25/20 2148  CREATININE 2.85* 1.34*     Discharge Instructions: Discharge Instructions    Diet - low sodium heart healthy   Complete by: As directed    Discharge instructions   Complete by: As directed    Ms. Branscom, it has been a pleasure taking care of you.  You were admitted with COVID infection and also an acute kidney injury likely due to dehydration.  While you were drinking plenty of water, your diarrhea may have outpaced your intake.  Continue drinking plenty of water, and try to eat as best you can.  Here are your discharge instructions.  1.  Follow-up with your PCP in 1 week 2.  Because your blood pressure has been low or normal here, I am holding your amlodipine and decreasing your clonidine to 0.1 mg for now (take half a tablet twice a day).  Your PCP may resume them if you become hypertensive again. 3.  Your urine showed some abnormalities which have been ongoing for years, but is not clear what is causing them.   Addressed this when you follow-up with your PCP. 4.  You are diagnosed with COVID-19 infection.  I am glad you are not having any respiratory symptoms, but please return to the hospital if you develop severe difficulty breathing.  You need to quarantine until 10 days from the start of your symptoms. 5.  I have discontinued your Pepcid since you are not using it regularly.   Increase activity slowly   Complete by: As directed       Signed: Andrew Au, MD 12/26/2020, 4:13 PM   Pager: [phone]

## 2020-12-26 ENCOUNTER — Telehealth: Payer: Self-pay | Admitting: Acute Care

## 2020-12-26 DIAGNOSIS — U071 COVID-19: Secondary | ICD-10-CM | POA: Diagnosis not present

## 2020-12-26 LAB — URINE CULTURE

## 2020-12-26 MED ORDER — CLONIDINE HCL 0.2 MG PO TABS: 0.1000 mg | ORAL_TABLET | Freq: Two times a day (BID) | ORAL | 2 refills | Status: DC

## 2020-12-26 NOTE — Progress Notes (Signed)
Discharge teaching complete. Meds, diet, activity, follow up appointments reviewed and all questions answered. Copy of instructions given to patient. Patient discharged home via wheelchair with daughter.  

## 2020-12-26 NOTE — Progress Notes (Signed)
Subjective:   Patient denies any particular complaints today, states she is ready to go home. Ate some of her lunch, but did not care for most of it because it was cold. Feels that she will eat better at home, and states that she will stay hydrated. Denies any further diarrhea.  Objective:  Vital signs in last 24 hours: Vitals:   12/25/20 1800 12/25/20 1815 12/25/20 1949 12/26/20 0616  BP: 127/90 131/84 135/85 121/76  Pulse: 80 79 98 (!) 101  Resp: 20 (!) 24 20 20   Temp:   98.2 F (36.8 C) 98.6 F (37 C)  TempSrc:   Oral Oral  SpO2: 97% 97% 94% 93%  Weight:      Height:        Physical Exam Constitutional: no acute distress Head: atraumatic ENT: external ears normal Eyes: EOMI Cardiovascular: extremities well perfused Pulmonary: effort normal, lungs clear to ascultation bilaterally Abdominal: flat Skin: warm and dry Neurological: alert, no focal deficit Psychiatric: normal mood and affect  Assessment/Plan: Rosezella Kronick is a 65 y.o. female with hx of CVA, HTN, HLD, tobacco use disorder, and Barrett's esophagus presenting with COVID-19 infection with poor appetite and AKI.  Active Problems:   Prolonged Q-T interval on ECG   Acute renal failure ([place])   COVID-19 pneumonia Began having COVID-19 symptoms about 6 days ago with exposure about 6-8 days ago. No respiratory symptoms at this time, but CXR with pneumonia. CBC showing leukopenia at 3.6. No oxygen requirement. Is complaining of loss of appetite and myalgias primarily in R hip, and diarrhea which has resolved. Mildly elevated inflammatory markers PCRP 3.1, Ddimer 0.76, ferritin 627).  -consider starting decadron if patient becomes hypoxic or develops any respiratory distress. Currently no need for COVID specific medications. -airborne and contact precautions -planned to trend inflammatory markers, but patient has phobia of needles -tylenol prn for fevers, headache, mild pain  AKI Abnormal urine studies Cr  2.85 on admission -> 1.34 today. Baseline ~0.7-0.9. FENa of 2% but this was after hydration was started. Suspect prerenal etiology from dehydration 2/2 lack of appetite, diarrhea, and insensible loss from fevers. Endorsed using ibuprofen 800mg  once daily for the past week, no other clear nephrotoxics. UA with moderate myoglobinuria, mild ketonuria, and proteinuria which appear to be chronic. UA also with WBC clumps -avoid nephrotoxic medications -monitor UOP, strict I/O's -f/u urine culture outpatient -repeat UA outpatient  Anion Gap Metabolic Acidosis Improved this morning, likely due to dehydration, poor intake, and kidney injury. -f/u BMP outpatient  QT prolongation QTc 535ms -Avoid QT prolonging medications -discontinue famotidine as patient is not using it regularly  CVA (2007) In left periventricular area, noted on MRI in 04/2006. No residual deficits. Did not tolerate ASA due to reflux. Was offered plavix, but declined. -continue ASA  HTN HLD On norvasc 5mg  daily and clonidine 0.2mg  BID at home. On zocor 20mg  qhs at home. -holding home antihypertensives due to soft BP on admission, now normotensive -can restart home clonidine once BP improves to avoid rebound HTN  GERD Chronic cough On pepcid 20mg  daily and omeprazole 20mg  daily at home, but states she has only been using the omeprazole and only occasionally -stop pepcid due to renal function and lack of use -continue protonix  Diet:  regular IVF:  none VTE:  heparin Prior to Admission Living Arrangement:  home Anticipated Discharge Location:  home Barriers to Discharge:  AKI Dispo: Anticipated discharge today  Andrew Au, MD 12/26/2020, 6:22 AM Pager: [phone] After 5pm  on weekdays and 1pm on weekends: On Call pager [phone]

## 2020-12-26 NOTE — Telephone Encounter (Signed)
[facility]:  We were contacted by the [facility].  Recommendations: Follow up CXR in 4-6 weeks with nipple markers to confirm stability or resolution of this finding. If rounded density remains and is not nipple shadow, please refer to [facility] for follow up nodule . Appointment requested: PCP to repeat CXR and refer for follow up if rounded density remains  Time frame: 4-6  weeks  Magdalen Spatz, NP [facility] 12/26/2020 10:09 AM

## 2020-12-26 NOTE — Telephone Encounter (Signed)
-----   Message from Annie Paras sent at 12/26/2020 10:02 AM EST ----- Regarding: Radiology Follow Up Rounded density in the right lower lobe may represent a nipple shadow. A 4-6 week follow-up two-view chest x-ray is recommended to confirm stability or resolution of this finding.

## 2020-12-27 ENCOUNTER — Telehealth: Payer: Self-pay | Admitting: Student

## 2020-12-27 ENCOUNTER — Telehealth: Payer: Self-pay

## 2020-12-27 NOTE — Telephone Encounter (Signed)
Patient can take tylenol for any ongoing muscles aches/pains or fevers. For cough, I would recommend any guaifenesin-based cough syrup. If she has any worsening of symptoms or shortness of breath, I would recommend seeking urgent medical attention.

## 2020-12-27 NOTE — Telephone Encounter (Signed)
TOC HFU appointment 01/03/2021 at 10:45 am.  Appointment was mailed to patient.

## 2020-12-27 NOTE — Telephone Encounter (Signed)
-----   Message from Andrew Au, MD sent at 12/26/2020  2:08 PM EST ----- Regarding: hospital f/u Please schedule this patient for a hospital f/u. She also has several overdue chronic care needs, so this may need to be a longer appointment to address these. I have outlined these in my discharge summary  Thanks Josh

## 2020-12-27 NOTE — Telephone Encounter (Signed)
Thanks so much. 

## 2020-12-27 NOTE — Telephone Encounter (Addendum)
Pt stated she was discharged from the hospital yesterday. She wants to know if she can take Tylenol - I asked for what reason, stated she still has a slight cough.  Thanks

## 2020-12-30 NOTE — Telephone Encounter (Signed)
Called pt - no answer; left message to call the office to f/u on her cough.  Return call from pt - I asked about her cough, she stated it better after she took ES Tylenol. Informed pt if she decides OTC cough med to buy the one specific for HBP - stated she will ask her pharmacist .

## 2020-12-30 NOTE — Telephone Encounter (Signed)
Thank you :)

## 2021-01-01 NOTE — Telephone Encounter (Signed)
Transition Care Management Unsuccessful Follow-up Telephone Call  Date of discharge and from where:  12/26/2020 [facility]  Attempts:  1st Attempt  Reason for unsuccessful TCM follow-up call:  Left voice message   Patient had previously r/s her HFU for 01/08/2019 at 3:15 with Dr. Charleen Kirks.  Hubbard Hartshorn, BSN, RN-BC

## 2021-01-01 NOTE — Telephone Encounter (Signed)
Transition Care Management Follow-up Telephone Call  Date of discharge and from where: 12/26/2020 [facility]  How have you been since you were released from the hospital? "I feel good." Denies SHOB, diarrhea.  Any questions or concerns? No  Items Reviewed:  Did the pt receive and understand the discharge instructions provided? Yes   Medications obtained and verified? Yes  Patient knew to d/c amlodipine famotidine, diclofenac gel, and methocarbanol. She is taking a half tab of clonidine. States she checks her BP daily and it is still low, but doesn't know the numbers. Encouraged patient to continue to drink fluids, especially water. States, " I drink plenty of water." Also encouraged slowly transitioning positions.  Other? N/A  Any new allergies since your discharge? No   Dietary orders reviewed? Yes  Do you have support at home? Yes , daughter lives with her.  Home Care and Equipment/Supplies: Were home health services ordered? not applicable If so, what is the name of the agency?   Has the agency set up a time to come to the patient's home? not applicable Were any new equipment or medical supplies ordered?  No What is the name of the medical supply agency?  Were you able to get the supplies/equipment? not applicable Do you have any questions related to the use of the equipment or supplies? No  Functional Questionnaire: (I = Independent and D = Dependent) ADLs: I  Bathing/Dressing- I  Meal Prep- Daughter cooks but patient does not like the smell of home cooking. States she only likes eating out; had a [employer] 2 days ago and half a sub yesterday.  Eating- I  Maintaining continence- I  Transferring/Ambulation- I  Managing Meds- I  Follow up appointments reviewed:   PCP Hospital f/u appt confirmed? Yes  Scheduled to see Dr. Charleen Kirks on 01/08/2021 @ 3:15.  [facility] f/u appt confirmed? NA   Are transportation arrangements needed? No , daughter drives  her.  If their condition worsens, is the pt aware to call PCP or go to the Emergency Dept.? Yes Was the patient provided with contact information for the PCP's office or ED? Yes. Also, given main hospital number [phone]) for after hours with instructions to ask for [facility] Resident on call.  Was to pt encouraged to call back with questions or concerns? Yes  L. Clarice Bonaventure, BSN, RN-BC

## 2021-01-03 ENCOUNTER — Encounter: Payer: Medicaid Other | Admitting: Student

## 2021-01-08 ENCOUNTER — Encounter: Payer: Self-pay | Admitting: Internal Medicine

## 2021-01-08 ENCOUNTER — Ambulatory Visit: Payer: Medicaid Other | Admitting: Internal Medicine

## 2021-01-08 ENCOUNTER — Other Ambulatory Visit: Payer: Self-pay

## 2021-01-08 VITALS — BP 132/82 | HR 64 | Temp 98.2°F | Wt 108.0 lb

## 2021-01-08 DIAGNOSIS — R9389 Abnormal findings on diagnostic imaging of other specified body structures: Secondary | ICD-10-CM

## 2021-01-08 DIAGNOSIS — R821 Myoglobinuria: Secondary | ICD-10-CM

## 2021-01-08 NOTE — Progress Notes (Signed)
Patient left without being seen by provider.

## 2021-01-20 ENCOUNTER — Encounter: Payer: Medicaid Other | Admitting: Student

## 2021-01-22 ENCOUNTER — Encounter: Payer: Medicaid Other | Admitting: Student

## 2021-01-22 ENCOUNTER — Telehealth: Payer: Self-pay | Admitting: *Deleted

## 2021-01-22 NOTE — Telephone Encounter (Signed)
Call to patient concerning missed appointment for today.  Message left that Clinics had called and to call to reschedule her appointment in the [facility].  Sander Nephew, RN 01/22/2021 4:07 PM.

## 2021-03-12 ENCOUNTER — Other Ambulatory Visit: Payer: Self-pay | Admitting: Student

## 2021-03-12 DIAGNOSIS — R918 Other nonspecific abnormal finding of lung field: Secondary | ICD-10-CM

## 2021-05-01 ENCOUNTER — Encounter: Payer: Self-pay | Admitting: *Deleted

## 2021-07-15 ENCOUNTER — Telehealth: Payer: Self-pay

## 2021-07-15 NOTE — Telephone Encounter (Signed)
LVM for pt to call back as soon as possible.   RE: scheduling for mobile mammo event in August or September.

## 2021-09-18 IMAGING — DX DG CHEST 1V PORT
1 series · 1 of 1 positions shown · non-contrast
Comparison: 08/27/2017

CLINICAL DATA: COVID.

EXAM:
PORTABLE CHEST 1 VIEW

[chest ap]
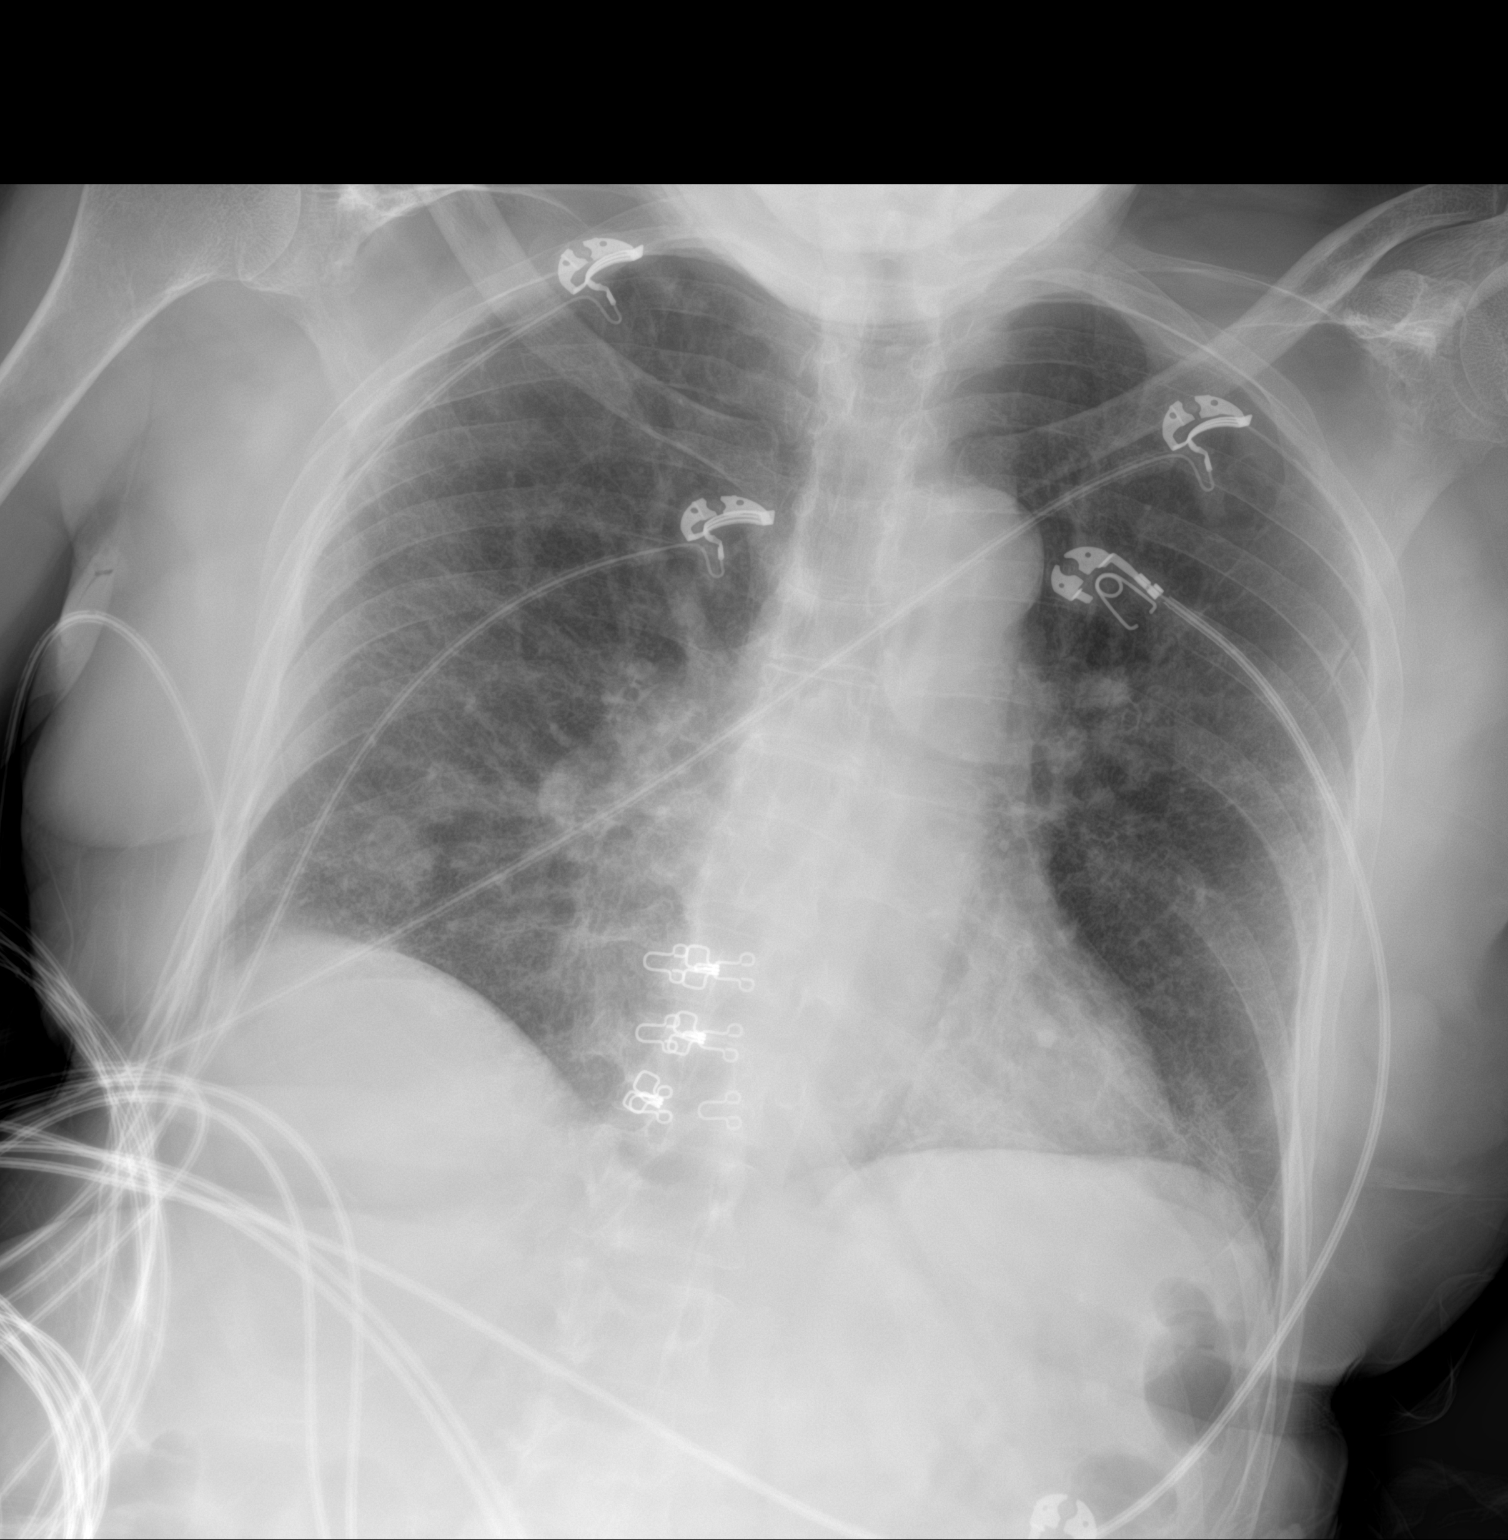

[1 of 1 positions shown; findings below may reference images not displayed]

FINDINGS: The heart size is enlarged. There are hazy bilateral airspace
opacities. There is a rounded density in the right lower lobe. There
is no pneumothorax. No large pleural effusion. Aortic calcifications
are noted.
IMPRESSION: 1. Hazy bilateral airspace opacities consistent with multifocal
pneumonia (viral or bacterial).
2. Rounded density in the right lower lobe may represent a nipple
shadow. A 4-6 week follow-up two-view chest x-ray is recommended to
confirm stability or resolution of this finding.

## 2021-12-02 ENCOUNTER — Other Ambulatory Visit: Payer: Self-pay

## 2021-12-02 DIAGNOSIS — I10 Essential (primary) hypertension: Secondary | ICD-10-CM

## 2021-12-31 ENCOUNTER — Ambulatory Visit (HOSPITAL_COMMUNITY): Payer: Medicaid Other

## 2022-01-23 ENCOUNTER — Other Ambulatory Visit: Payer: Self-pay | Admitting: Family Medicine

## 2022-01-23 DIAGNOSIS — Z1231 Encounter for screening mammogram for malignant neoplasm of breast: Secondary | ICD-10-CM

## 2022-02-19 ENCOUNTER — Ambulatory Visit
Admission: RE | Admit: 2022-02-19 | Discharge: 2022-02-19 | Disposition: A | Discharge status: Home / Self Care | Payer: Medicaid Other | Source: Ambulatory Visit | Attending: Family Medicine | Admitting: Family Medicine

## 2022-02-19 ENCOUNTER — Other Ambulatory Visit: Payer: Self-pay

## 2022-04-29 ENCOUNTER — Encounter: Payer: Medicaid Other | Admitting: Obstetrics

## 2022-11-13 IMAGING — MG MM DIGITAL SCREENING BILAT W/ TOMO AND CAD
6 of 10 series · 6 of 30 positions shown · non-contrast
Comparison: Previous exam(s).

CLINICAL DATA: Screening.

EXAM:
DIGITAL SCREENING BILATERAL MAMMOGRAM WITH TOMOSYNTHESIS AND CAD
TECHNIQUE: Bilateral screening digital craniocaudal and mediolateral oblique
mammograms were obtained. Bilateral screening digital breast
tomosynthesis was performed. The images were evaluated with
computer-aided detection.

[R MLO synth-2D (1 of 2)]
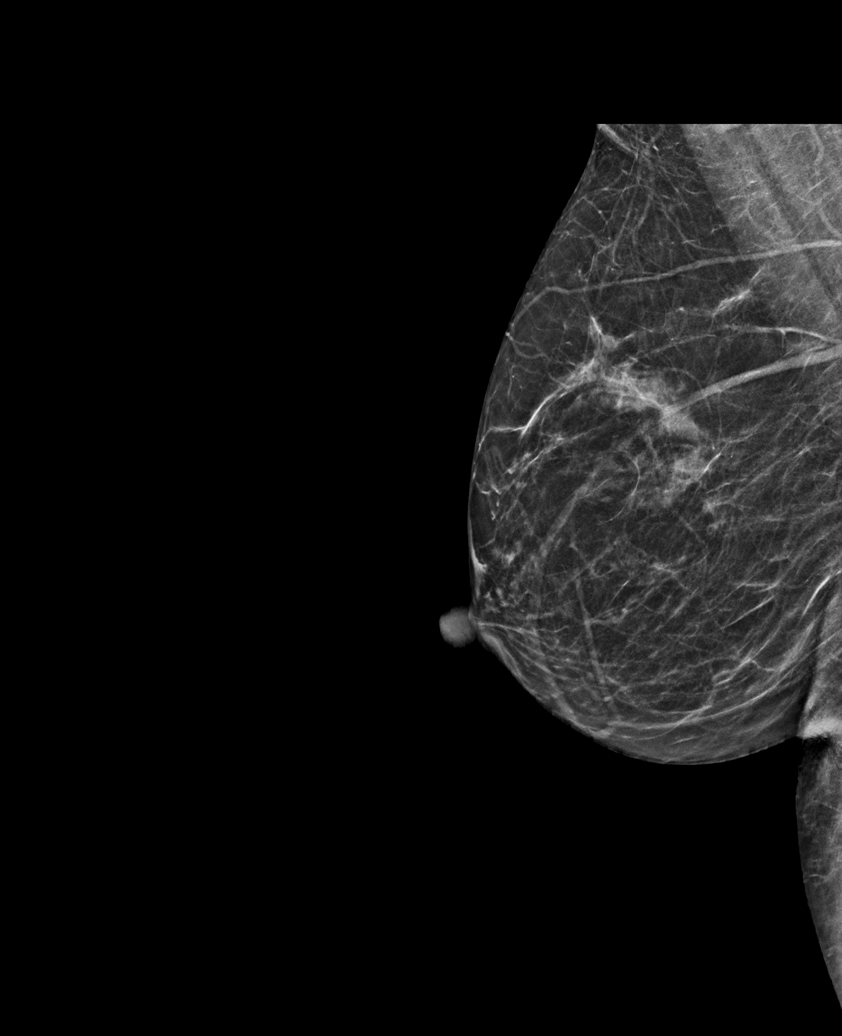

[L CC synth-2D]
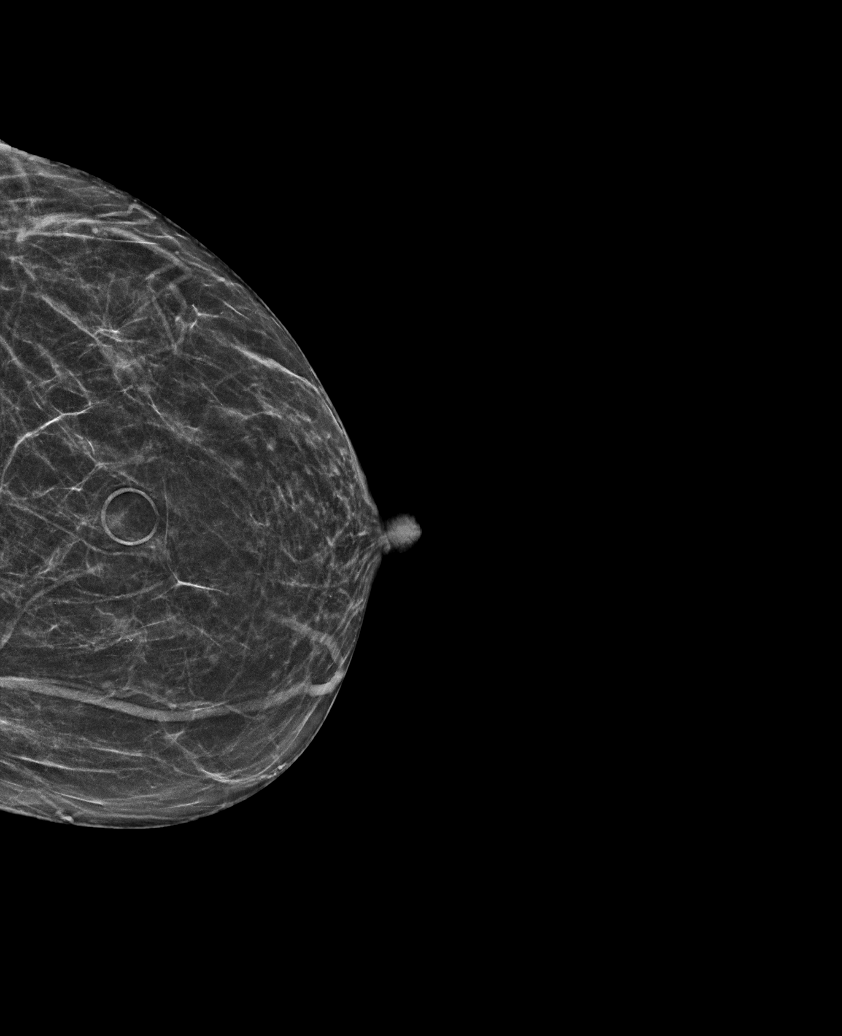

[L MLO synth-2D]
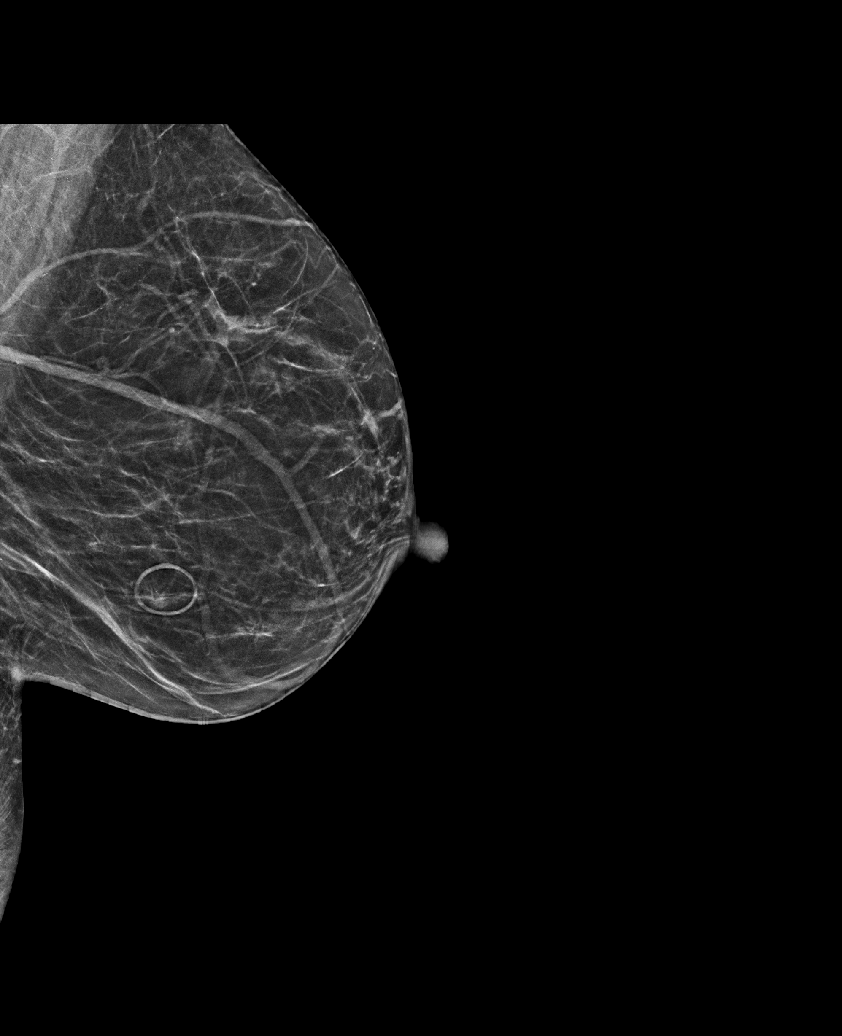

[R CC synth-2D]
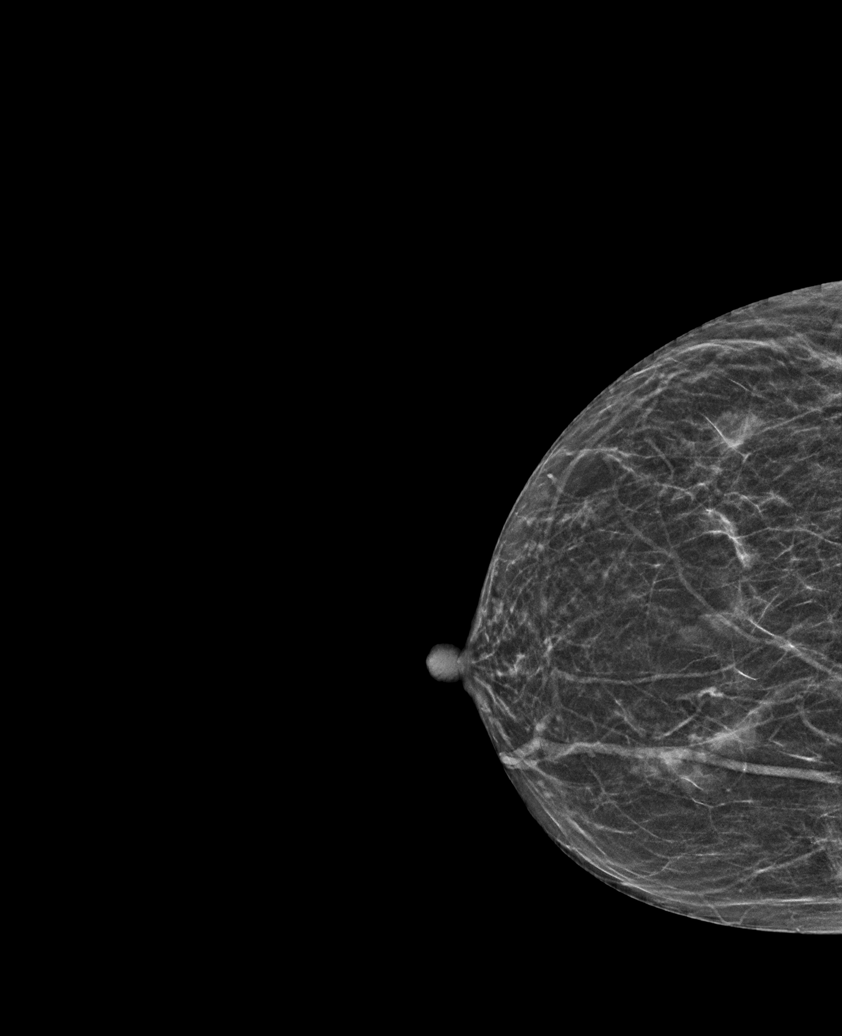

[R MLO synth-2D (2 of 2)]
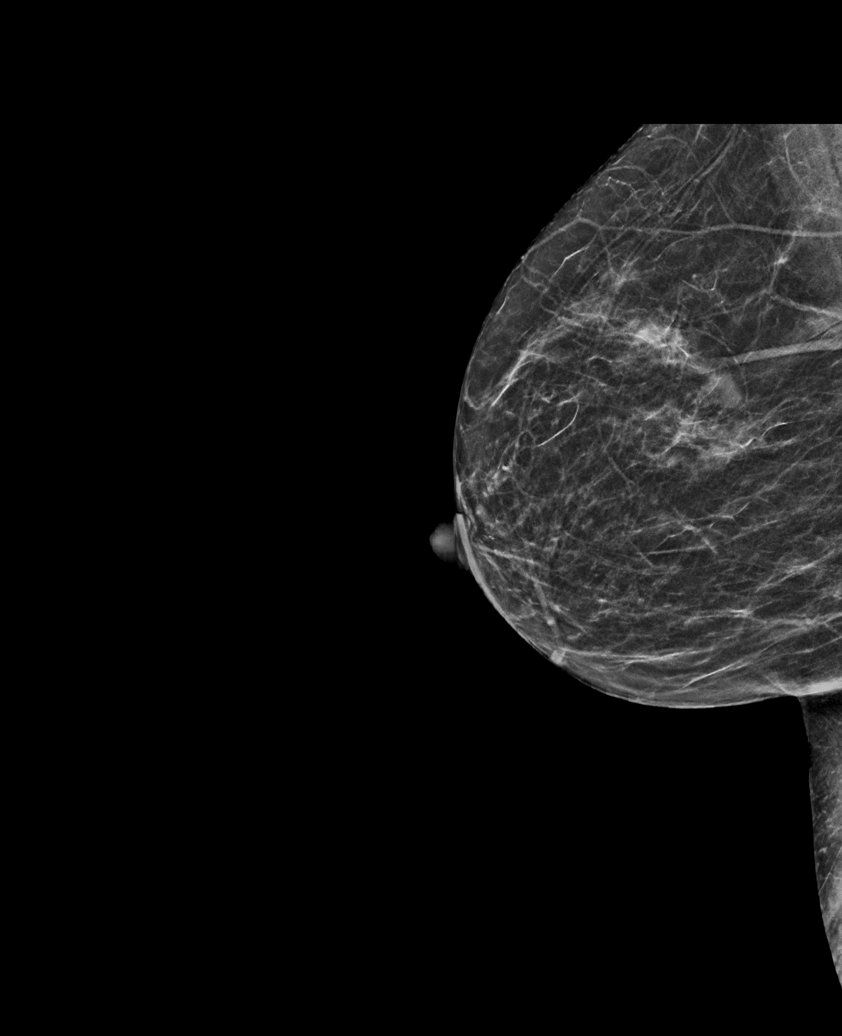

[R MLO tomo · tomo slice 25/49.0]
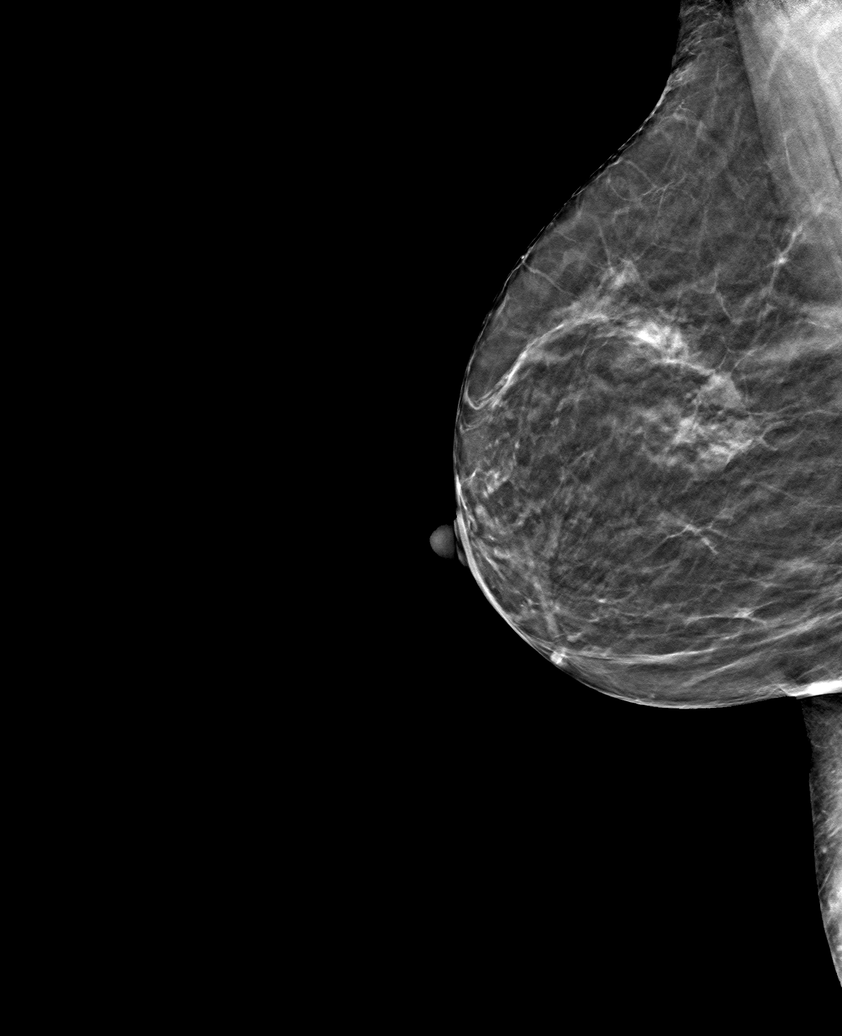

[6 of 30 positions shown; findings below may reference images not displayed]

ACR Breast Density Category b: There are scattered areas of
fibroglandular density.
FINDINGS: There are no findings suspicious for malignancy.
IMPRESSION: No mammographic evidence of malignancy. A result letter of this
screening mammogram will be mailed directly to the patient.

RECOMMENDATION:
Screening mammogram in one year. (Code:51-O-LD2)

BI-RADS CATEGORY  1: Negative.

## 2022-12-27 ENCOUNTER — Emergency Department (HOSPITAL_COMMUNITY): Payer: Medicaid Other

## 2022-12-27 ENCOUNTER — Emergency Department (HOSPITAL_COMMUNITY)
Admission: EM | Admit: 2022-12-27 | Discharge: 2022-12-27 | Disposition: A | Discharge status: Home / Self Care | Payer: Medicaid Other | Attending: Emergency Medicine | Admitting: Emergency Medicine

## 2022-12-27 DIAGNOSIS — Z8673 Personal history of transient ischemic attack (TIA), and cerebral infarction without residual deficits: Secondary | ICD-10-CM | POA: Diagnosis not present

## 2022-12-27 DIAGNOSIS — N3001 Acute cystitis with hematuria: Secondary | ICD-10-CM | POA: Diagnosis not present

## 2022-12-27 DIAGNOSIS — I10 Essential (primary) hypertension: Secondary | ICD-10-CM | POA: Diagnosis not present

## 2022-12-27 DIAGNOSIS — R1030 Lower abdominal pain, unspecified: Secondary | ICD-10-CM | POA: Diagnosis present

## 2022-12-27 DIAGNOSIS — Z79899 Other long term (current) drug therapy: Secondary | ICD-10-CM | POA: Diagnosis not present

## 2022-12-27 LAB — COMPREHENSIVE METABOLIC PANEL
ALT: 11 U/L (ref 0–44)
AST: 15 U/L (ref 15–41)
Albumin: 4.3 g/dL (ref 3.5–5.0)
Alkaline Phosphatase: 80 U/L (ref 38–126)
Anion gap: 7 (ref 5–15)
BUN: 8 mg/dL (ref 8–23)
CO2: 23 mmol/L (ref 22–32)
Calcium: 9 mg/dL (ref 8.9–10.3)
Chloride: 107 mmol/L (ref 98–111)
Creatinine, Ser: 0.69 mg/dL (ref 0.44–1.00)
GFR, Estimated: >60 mL/min (ref >60)
Glucose, Bld: 99 mg/dL (ref 70–99)
Potassium: 3 mmol/L — ABNORMAL LOW (ref 3.5–5.1)
Sodium: 137 mmol/L (ref 135–145)
Total Bilirubin: 0.8 mg/dL (ref 0.3–1.2)
Total Protein: 7.4 g/dL (ref 6.5–8.1)

## 2022-12-27 LAB — URINALYSIS, ROUTINE W REFLEX MICROSCOPIC
Bilirubin Urine: NEGATIVE
Glucose, UA: NEGATIVE mg/dL
Ketones, ur: 5 mg/dL — AB
Nitrite: NEGATIVE
Protein, ur: 30 mg/dL — AB
RBC / HPF: >50 RBC/hpf — ABNORMAL HIGH (ref 0–5)
Specific Gravity, Urine: 1.013 (ref 1.005–1.030)
WBC, UA: >50 WBC/hpf — ABNORMAL HIGH (ref 0–5)
pH: 5 (ref 5.0–8.0)

## 2022-12-27 LAB — CBC WITH DIFFERENTIAL/PLATELET
Abs Immature Granulocytes: 0.03 10*3/uL (ref 0.00–0.07)
Basophils Absolute: 0 10*3/uL (ref 0.0–0.1)
Basophils Relative: 0 %
Eosinophils Absolute: 0 10*3/uL (ref 0.0–0.5)
Eosinophils Relative: 0 %
HCT: 43.3 % (ref 36.0–46.0)
Hemoglobin: 14.7 g/dL (ref 12.0–15.0)
Immature Granulocytes: 0 %
Lymphocytes Relative: 19 %
Lymphs Abs: 2.2 10*3/uL (ref 0.7–4.0)
MCH: 32.4 pg (ref 26.0–34.0)
MCHC: 33.9 g/dL (ref 30.0–36.0)
MCV: 95.4 fL (ref 80.0–100.0)
Monocytes Absolute: 0.6 10*3/uL (ref 0.1–1.0)
Monocytes Relative: 5 %
Neutro Abs: 8.7 10*3/uL — ABNORMAL HIGH (ref 1.7–7.7)
Neutrophils Relative %: 76 %
Platelets: 277 10*3/uL (ref 150–400)
RBC: 4.54 MIL/uL (ref 3.87–5.11)
RDW: 13.8 % (ref 11.5–15.5)
WBC: 11.5 10*3/uL — ABNORMAL HIGH (ref 4.0–10.5)
nRBC: 0 % (ref 0.0–0.2)

## 2022-12-27 LAB — LIPASE, BLOOD: Lipase: 36 U/L (ref 11–51)

## 2022-12-27 MED ORDER — SODIUM CHLORIDE 0.9 % IV SOLN
1.0000 g | Freq: Once | INTRAVENOUS | Status: AC
  Administered 2022-12-27: 1 g via INTRAVENOUS
  Filled 2022-12-27: qty 10

## 2022-12-27 MED ORDER — POTASSIUM CHLORIDE CRYS ER 20 MEQ PO TBCR
40.0000 meq | EXTENDED_RELEASE_TABLET | Freq: Once | ORAL | Status: AC
  Administered 2022-12-27: 40 meq via ORAL
  Filled 2022-12-27: qty 2

## 2022-12-27 MED ORDER — PHENAZOPYRIDINE HCL 200 MG PO TABS: 200.0000 mg | ORAL_TABLET | Freq: Three times a day (TID) | ORAL | 0 refills | Status: DC

## 2022-12-27 MED ORDER — CEPHALEXIN 500 MG PO CAPS: 500.0000 mg | ORAL_CAPSULE | Freq: Four times a day (QID) | ORAL | 0 refills | Status: DC

## 2022-12-27 NOTE — ED Provider Notes (Signed)
[facility] Provider Note   CSN: [id number] Arrival date & time: 12/27/22  1458     History  Chief Complaint  Patient presents with   Abdominal Pain   Hematuria    Brittany Burns is a 67 y.o. female with a past medical history significant for hypertension, previous CVA, hyperlipidemia, vitamin D deficiency, and history of prolonged QTc who presents to the ED due to lower abdominal pain, dysuria, and hematuria that started yesterday.  Patient admits to lower abdominal pain only with urination.  She admits to some hematuria earlier today.  Notes she was wearing a panty liner after episode of hematuria and has not had any blood since.  No fever or chills.  Denies back/flank pain.  No history of kidney stones.  Denies abdominal pain without urination.  Denies nausea, vomiting, diarrhea. No injury.   History obtained from patient and past medical records. No interpreter used during encounter.       Home Medications Prior to Admission medications   Medication Sig Start Date End Date Taking? Authorizing Provider  cephALEXin (KEFLEX) 500 MG capsule Take 1 capsule (500 mg total) by mouth 4 (four) times daily. 12/27/22  Yes Okley Magnussen, Druscilla Brownie, PA-C  phenazopyridine (PYRIDIUM) 200 MG tablet Take 1 tablet (200 mg total) by mouth 3 (three) times daily. 12/27/22  Yes Alfhild Partch, Druscilla Brownie, PA-C  calcium-vitamin D 250-100 MG-UNIT tablet Take 1 tablet by mouth 2 (two) times daily. 12/12/20   Jeralyn Bennett, MD  cloNIDine (CATAPRES) 0.2 MG tablet Take 0.5 tablets (0.1 mg total) by mouth 2 (two) times daily. 12/26/20   Andrew Au, MD  fluticasone [facility] ALLERGY RELIEF) 50 MCG/ACT nasal spray Place 2 sprays into both nostrils daily. 12/12/20   Jeralyn Bennett, MD  omeprazole (PRILOSEC) 20 MG capsule take 1 capsule BY MOUTH EVERY DAY 30 minutes before lunch/dinner Patient not taking: Reported on 12/25/2020 12/12/20   Jeralyn Bennett, MD  simvastatin  (ZOCOR) 20 MG tablet TAKE 1 TABLET(20 MG) BY MOUTH EVERY EVENING Patient taking differently: Take 20 mg by mouth every evening. 12/12/20   Jeralyn Bennett, MD      Allergies    Patient has no known allergies.    Review of Systems   Review of Systems  Constitutional:  Negative for chills and fever.  Respiratory:  Negative for shortness of breath.   Cardiovascular:  Negative for chest pain.  Gastrointestinal:  Positive for abdominal pain. Negative for diarrhea, nausea and vomiting.  Genitourinary:  Positive for dysuria and hematuria.  All other systems reviewed and are negative.   Physical Exam Updated Vital Signs BP (!) 178/133   Pulse 85   Temp 98.8 F (37.1 C) (Oral)   Resp 17   SpO2 98%  Physical Exam Vitals and nursing note reviewed.  Constitutional:      General: She is not in acute distress.    Appearance: She is not ill-appearing.  HENT:     Head: Normocephalic.  Eyes:     Pupils: Pupils are equal, round, and reactive to light.  Cardiovascular:     Rate and Rhythm: Normal rate and regular rhythm.     Pulses: Normal pulses.     Heart sounds: Normal heart sounds. No murmur heard.    No friction rub. No gallop.  Pulmonary:     Effort: Pulmonary effort is normal.     Breath sounds: Normal breath sounds.  Abdominal:     General: Abdomen is flat. There  is no distension.     Palpations: Abdomen is soft.     Tenderness: There is no abdominal tenderness. There is no guarding or rebound.     Comments: Abdomen soft, nondistended, nontender to palpation in all quadrants without guarding or peritoneal signs. No rebound.   Musculoskeletal:        General: Normal range of motion.     Cervical back: Neck supple.  Skin:    General: Skin is warm and dry.  Neurological:     General: No focal deficit present.     Mental Status: She is alert.  Psychiatric:        Mood and Affect: Mood normal.        Behavior: Behavior normal.     ED Results / Procedures / Treatments    Labs (all labs ordered are listed, but only abnormal results are displayed) Labs Reviewed  CBC WITH DIFFERENTIAL/PLATELET - Abnormal; Notable for the following components:      Result Value   WBC 11.5 (*)    Neutro Abs 8.7 (*)    All other components within normal limits  COMPREHENSIVE METABOLIC PANEL - Abnormal; Notable for the following components:   Potassium 3.0 (*)    All other components within normal limits  URINALYSIS, ROUTINE W REFLEX MICROSCOPIC - Abnormal; Notable for the following components:   APPearance CLOUDY (*)    Hgb urine dipstick LARGE (*)    Ketones, ur 5 (*)    Protein, ur 30 (*)    Leukocytes,Ua LARGE (*)    RBC / HPF >50 (*)    WBC, UA >50 (*)    Bacteria, UA RARE (*)    All other components within normal limits  URINE CULTURE  LIPASE, BLOOD    EKG None  Radiology CT Renal Stone Study  Result Date: 12/27/2022 CLINICAL DATA:  Flank pain. EXAM: CT ABDOMEN AND PELVIS WITHOUT CONTRAST TECHNIQUE: Multidetector CT imaging of the abdomen and pelvis was performed following the standard protocol without IV contrast. RADIATION DOSE REDUCTION: This exam was performed according to the departmental dose-optimization program which includes automated exposure control, adjustment of the mA and/or kV according to patient size and/or use of iterative reconstruction technique. COMPARISON:  November 15, 2014 FINDINGS: Lower chest: Mild lingular linear atelectasis is noted. Hepatobiliary: A punctate parenchymal calcification is seen within the right lobe of the liver no gallstones, gallbladder wall thickening, or biliary dilatation. Pancreas: Unremarkable. No pancreatic ductal dilatation or surrounding inflammatory changes. Spleen: Normal in size without focal abnormality. Adrenals/Urinary Tract: Adrenal glands are unremarkable. Kidneys are normal, without renal calculi, focal lesion, or hydronephrosis. Bladder is unremarkable. Stomach/Bowel: Stomach is within normal limits.  Appendix appears normal. No evidence of bowel wall thickening, distention, or inflammatory changes. Vascular/Lymphatic: Aortic atherosclerosis. No enlarged abdominal or pelvic lymph nodes. Reproductive: Uterus and bilateral adnexa are unremarkable. Other: No abdominal wall hernia or abnormality. No abdominopelvic ascites. Musculoskeletal: No acute or significant osseous findings. IMPRESSION: 1. No acute abnormality seen in the abdomen or pelvis. 2. Aortic atherosclerosis. Aortic Atherosclerosis (ICD10-I70.0). Electronically Signed   By: Virgina Norfolk M.D.   On: 12/27/2022 17:23    Procedures Procedures    Medications Ordered in ED Medications  cefTRIAXone (ROCEPHIN) 1 g in sodium chloride 0.9 % 100 mL IVPB (0 g Intravenous Stopped 12/27/22 1721)  potassium chloride SA (KLOR-CON M) CR tablet 40 mEq (40 mEq Oral Given 12/27/22 1659)    ED Course/ Medical Decision Making/ A&P Clinical Course as of 12/27/22 1730  Sun Dec 27, 2022  1603 Leukocytes,Ua(!): LARGE [CA]  1603 Bacteria, UA(!): RARE [CA]  1603 WBC(!): 11.5 [CA]  1636 Potassium(!): 3.0 [CA]    Clinical Course User Index [CA] Suzy Bouchard, PA-C                             Medical Decision Making Amount and/or Complexity of Data Reviewed Labs: ordered. Decision-making details documented in ED Course. Radiology: ordered and independent interpretation performed. Decision-making details documented in ED Course.  Risk Prescription drug management.   This patient presents to the ED for concern of dysuria, this involves an extensive number of treatment options, and is a complaint that carries with it a high risk of complications and morbidity.  The differential diagnosis includes UTI, kidney stone, bladder cancer, etc  67 year old female presents to the ED due to dysuria, lower abdominal pain, and hematuria.  Symptoms started yesterday.  No history of kidney stones.  Denies fever, chills, nausea, vomiting, diarrhea.  Upon  arrival, stable vitals.  Patient in no acute distress.  Benign physical exam.  Abdomen soft, nondistended, nontender. Suspect symptoms most likely related to acute cystitis.  Routine labs ordered.  UA to rule out UTI.  Patient currently denies any pain at this time.  UA significant for large leukocytes, negative nitrates, and rare bacteria.  Large hematuria.  Given urinary symptoms will give 1 dose of Rocephin.  CT renal study ordered to rule out kidney stone.  CBC significant for mild leukocytosis at 11.5.  CMP significant for hypokalemia at 3.  Normal renal function.  Potassium repleted here in the ED.  Lipase normal.  Doubt pancreatitis.  CT renal study personally reviewed and interpreted which is negative for any acute abnormalities.  No kidney stones.  Suspect symptoms related to acute cystitis.  Patient discharged with Keflex.  Advised patient to follow-up with PCP if symptoms not improve over the next few days. Strict ED precautions discussed with patient. Patient states understanding and agrees to plan. Patient discharged home in no acute distress and stable vitals  No PCP Hx HTN, hx CVA, HL       Final Clinical Impression(s) / ED Diagnoses Final diagnoses:  Acute cystitis with hematuria    Rx / DC Orders ED Discharge Orders          Ordered    cephALEXin (KEFLEX) 500 MG capsule  4 times daily        12/27/22 1727    phenazopyridine (PYRIDIUM) 200 MG tablet  3 times daily        12/27/22 1727              Karie Kirks 12/27/22 1732    Drenda Freeze, MD 12/27/22 2218

## 2022-12-27 NOTE — ED Triage Notes (Signed)
Pt c/o lower abdominal pain with hematuria and painful urination. Pt denies any N/V/D.

## 2022-12-27 NOTE — ED Notes (Signed)
Pt refuses there call bell/tv remote. Explained to pt that it was how they would call if they needed anything, pt still refused.

## 2022-12-27 NOTE — Discharge Instructions (Addendum)
It was a pleasure taking care of you today.  As discussed, your labs were reassuring.  Your urine showed a possible urinary tract infection.  I am sending you home with antibiotics.  Take as prescribed and finish all antibiotics.  Please follow-up with PCP if symptoms do not improve over the next few days.  Return to the ER for any worsening symptoms.

## 2022-12-29 LAB — URINE CULTURE: Culture: 50000 — AB

## 2022-12-30 ENCOUNTER — Telehealth (HOSPITAL_BASED_OUTPATIENT_CLINIC_OR_DEPARTMENT_OTHER): Payer: Self-pay | Admitting: Emergency Medicine

## 2022-12-30 NOTE — Telephone Encounter (Signed)
Post ED Visit - Positive Culture Follow-up  Culture report reviewed by antimicrobial stewardship pharmacist: [facility] Team '[]'$  Elenor Quinones, Pharm.D. '[x]'$  Heide Guile, Pharm.D., BCPS AQ-ID '[]'$  Parks Neptune, Pharm.D., BCPS '[]'$  Alycia Rossetti, Pharm.D., BCPS '[]'$  [place], Pharm.D., BCPS, AAHIVP '[]'$  Legrand Como, Pharm.D., BCPS, AAHIVP '[]'$  Salome Arnt, PharmD, BCPS '[]'$  Johnnette Gourd, PharmD, BCPS '[]'$  Hughes Better, PharmD, BCPS '[]'$  Leeroy Cha, PharmD '[]'$  Laqueta Linden, PharmD, BCPS '[]'$  Albertina Parr, PharmD  [facility] Team '[]'$  Leodis Sias, PharmD '[]'$  Lindell Spar, PharmD '[]'$  Royetta Asal, PharmD '[]'$  Graylin Shiver, Rph '[]'$  Rema Fendt) Glennon Mac, PharmD '[]'$  Arlyn Dunning, PharmD '[]'$  Netta Cedars, PharmD '[]'$  Dia Sitter, PharmD '[]'$  Leone Haven, PharmD '[]'$  Gretta Arab, PharmD '[]'$  Theodis Shove, PharmD '[]'$  Peggyann Juba, PharmD '[]'$  Reuel Boom, PharmD   Positive urine culture Treated with cephalexin, organism sensitive to the same and no further patient follow-up is required at this time.  Hazle Nordmann 12/30/2022, 12:37 PM

## 2023-02-23 ENCOUNTER — Encounter (HOSPITAL_COMMUNITY): Payer: Self-pay

## 2023-02-23 ENCOUNTER — Ambulatory Visit (HOSPITAL_COMMUNITY)
Admission: EM | Admit: 2023-02-23 | Discharge: 2023-02-23 | Disposition: A | Discharge status: Home / Self Care | Payer: Medicaid Other | Attending: Emergency Medicine | Admitting: Emergency Medicine

## 2023-02-23 DIAGNOSIS — I10 Essential (primary) hypertension: Secondary | ICD-10-CM | POA: Diagnosis present

## 2023-02-23 DIAGNOSIS — N3001 Acute cystitis with hematuria: Secondary | ICD-10-CM | POA: Diagnosis not present

## 2023-02-23 LAB — POCT URINALYSIS DIPSTICK, ED / UC
Glucose, UA: NEGATIVE mg/dL
Ketones, ur: NEGATIVE mg/dL
Leukocytes,Ua: NEGATIVE
Nitrite: NEGATIVE
Protein, ur: 100 mg/dL — AB
Specific Gravity, Urine: >=1.03 (ref 1.005–1.030)
Urobilinogen, UA: 0.2 mg/dL (ref 0.0–1.0)
pH: 5.5 (ref 5.0–8.0)

## 2023-02-23 MED ORDER — AMLODIPINE BESYLATE 10 MG PO TABS: 10.0000 mg | ORAL_TABLET | Freq: Every day | ORAL | 0 refills | Status: DC

## 2023-02-23 MED ORDER — CEPHALEXIN 500 MG PO CAPS: 500.0000 mg | ORAL_CAPSULE | Freq: Two times a day (BID) | ORAL | 0 refills | Status: AC

## 2023-02-23 NOTE — ED Triage Notes (Signed)
Pt is here for possible kidney pain on and off x xfew days. Pt has urgency and pressure when urinating

## 2023-02-23 NOTE — Discharge Instructions (Addendum)
Please start taking the amlodipine DAILY for your blood pressure. This is very important to get your blood pressure under control. If you have any symptoms such as headache, dizziness, chest pain, feeling short of breath, abdominal pain, or weakness, you need to go immediatly to the emergency department.  For your urinary symptoms, take the keflex twice daily for 5 days. Take with food. Increase water to 64 oz daily.  Please follow with your new primary care provider regarding blood pressure. Do not miss this appointment!!
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
## 2023-02-25 LAB — URINE CULTURE: Culture: 20000 — AB

## 2023-03-01 ENCOUNTER — Ambulatory Visit: Payer: Medicaid Other | Admitting: Family Medicine

## 2023-03-22 ENCOUNTER — Telehealth (HOSPITAL_COMMUNITY): Payer: Self-pay | Admitting: Emergency Medicine

## 2023-03-22 NOTE — Telephone Encounter (Signed)
Patient called front desk expressing her frustration that PCP appt that was scheduled is for "all the way in [place]".  This RN called patient back to explain that for locations closest to her address she would need to wait longer to get in.  Patient states she only has three pills left of her HTN meds.  Encouraged televisit and setting up a new PCP appt closer (patient cancelled the other one).  Offered to help, patient not interested at this time, but states she does not want to mess with the website and will come back in for med refills and will address the PCP appointment at that time.

## 2023-03-24 ENCOUNTER — Encounter (HOSPITAL_COMMUNITY): Payer: Self-pay

## 2023-03-24 ENCOUNTER — Ambulatory Visit (HOSPITAL_COMMUNITY)
Admission: EM | Admit: 2023-03-24 | Discharge: 2023-03-24 | Disposition: A | Discharge status: Home / Self Care | Payer: Medicaid Other | Attending: Emergency Medicine | Admitting: Emergency Medicine

## 2023-03-24 DIAGNOSIS — I10 Essential (primary) hypertension: Secondary | ICD-10-CM

## 2023-03-24 DIAGNOSIS — Z760 Encounter for issue of repeat prescription: Secondary | ICD-10-CM | POA: Diagnosis not present

## 2023-03-24 DIAGNOSIS — K219 Gastro-esophageal reflux disease without esophagitis: Secondary | ICD-10-CM

## 2023-03-24 MED ORDER — AMLODIPINE BESYLATE 10 MG PO TABS: 10.0000 mg | ORAL_TABLET | Freq: Every day | ORAL | 0 refills | Status: DC

## 2023-03-24 MED ORDER — OMEPRAZOLE 20 MG PO CPDR: 20.0000 mg | DELAYED_RELEASE_CAPSULE | Freq: Every day | ORAL | 0 refills | Status: DC

## 2023-03-24 NOTE — ED Triage Notes (Signed)
Pt requesting a refill for her amlodipine and pepcid. States needs a new PCP.

## 2023-03-24 NOTE — Discharge Instructions (Signed)
I have sent refills in of your omeprazole and your amlodipine.  It is important that you keep your scheduled follow-up with your primary care provider so they can monitor your chronic illnesses.  Please return to clinic or seek immediate care if you develop chest pain, shortness of breath, blurred vision, or any new concerning symptoms.
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
## 2023-04-21 ENCOUNTER — Ambulatory Visit: Payer: Medicaid Other | Admitting: Family

## 2023-04-21 ENCOUNTER — Encounter: Payer: Self-pay | Admitting: Family

## 2023-04-21 VITALS — BP 120/64 | HR 92 | Temp 97.8°F | Resp 20 | Ht 62.0 in | Wt 94.4 lb

## 2023-04-21 DIAGNOSIS — I700 Atherosclerosis of aorta: Secondary | ICD-10-CM

## 2023-04-21 DIAGNOSIS — I10 Essential (primary) hypertension: Secondary | ICD-10-CM | POA: Diagnosis not present

## 2023-04-21 DIAGNOSIS — Z7689 Persons encountering health services in other specified circumstances: Secondary | ICD-10-CM | POA: Diagnosis not present

## 2023-04-21 DIAGNOSIS — Z124 Encounter for screening for malignant neoplasm of cervix: Secondary | ICD-10-CM

## 2023-04-21 DIAGNOSIS — E785 Hyperlipidemia, unspecified: Secondary | ICD-10-CM

## 2023-04-21 DIAGNOSIS — Z1211 Encounter for screening for malignant neoplasm of colon: Secondary | ICD-10-CM

## 2023-04-21 DIAGNOSIS — K219 Gastro-esophageal reflux disease without esophagitis: Secondary | ICD-10-CM

## 2023-04-21 DIAGNOSIS — F17210 Nicotine dependence, cigarettes, uncomplicated: Secondary | ICD-10-CM

## 2023-04-21 MED ORDER — AMLODIPINE BESYLATE 10 MG PO TABS: 10.0000 mg | ORAL_TABLET | Freq: Every day | ORAL | 1 refills | Status: DC

## 2023-04-21 MED ORDER — ASPIRIN 81 MG PO TBEC: 81.0000 mg | DELAYED_RELEASE_TABLET | Freq: Every day | ORAL | 12 refills | Status: DC

## 2023-04-21 MED ORDER — SIMVASTATIN 20 MG PO TABS: 20.0000 mg | ORAL_TABLET | Freq: Every evening | ORAL | 1 refills | Status: DC

## 2023-04-21 MED ORDER — OMEPRAZOLE 20 MG PO CPDR: 20.0000 mg | DELAYED_RELEASE_CAPSULE | Freq: Every day | ORAL | 1 refills | Status: DC
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
## 2023-04-22 DIAGNOSIS — K219 Gastro-esophageal reflux disease without esophagitis: Secondary | ICD-10-CM | POA: Insufficient documentation

## 2023-04-22 DIAGNOSIS — I700 Atherosclerosis of aorta: Secondary | ICD-10-CM | POA: Insufficient documentation

## 2023-04-27 ENCOUNTER — Other Ambulatory Visit: Payer: Medicaid Other

## 2023-04-27 DIAGNOSIS — E785 Hyperlipidemia, unspecified: Secondary | ICD-10-CM

## 2023-04-27 DIAGNOSIS — I10 Essential (primary) hypertension: Secondary | ICD-10-CM

## 2023-05-25 LAB — COLOGUARD: COLOGUARD: NEGATIVE

## 2023-10-23 ENCOUNTER — Other Ambulatory Visit: Payer: Self-pay | Admitting: Family

## 2023-10-23 DIAGNOSIS — I10 Essential (primary) hypertension: Secondary | ICD-10-CM

## 2023-10-25 NOTE — Telephone Encounter (Signed)
Patient medication is expired. Medication pend and sent to PCP [place], Donalee Citrin, NP for approval.

## 2023-10-27 ENCOUNTER — Telehealth: Payer: Self-pay | Admitting: Family

## 2023-10-27 NOTE — Telephone Encounter (Signed)
Patient called and would like her amlodipine refilled (she only has 3 pills left). She has an appointment scheduled for 11/03/23 with Dinah and did not want it moved up. She also wants to know if she can take her acid reflux med in the morning and in the evening.

## 2023-10-27 NOTE — Telephone Encounter (Signed)
Patient informed that rx for amlodipine was approved 2 days ago for # 90 with 1 additional refill.   Dinah please advise on patients requests to take prilosec twice daily

## 2023-10-28 ENCOUNTER — Other Ambulatory Visit: Payer: Self-pay

## 2023-10-28 DIAGNOSIS — K219 Gastro-esophageal reflux disease without esophagitis: Secondary | ICD-10-CM

## 2023-10-28 MED ORDER — OMEPRAZOLE 20 MG PO CPDR: 20.0000 mg | DELAYED_RELEASE_CAPSULE | Freq: Two times a day (BID) | ORAL | 1 refills | Status: DC

## 2023-10-28 NOTE — Telephone Encounter (Signed)
Called and made patient aware and sent medication to the pharmacy

## 2023-10-28 NOTE — Telephone Encounter (Signed)
Okay to take Prilosec twice daily and keep follow up with [facility]

## 2023-11-03 ENCOUNTER — Encounter: Payer: Self-pay | Admitting: Family

## 2023-11-03 ENCOUNTER — Ambulatory Visit (INDEPENDENT_AMBULATORY_CARE_PROVIDER_SITE_OTHER): Payer: Medicare Other | Admitting: Family

## 2023-11-03 VITALS — BP 120/88 | HR 82 | Temp 97.5°F | Resp 18 | Ht 62.0 in | Wt 90.8 lb

## 2023-11-03 DIAGNOSIS — F5101 Primary insomnia: Secondary | ICD-10-CM | POA: Diagnosis not present

## 2023-11-03 DIAGNOSIS — R634 Abnormal weight loss: Secondary | ICD-10-CM | POA: Diagnosis not present

## 2023-11-03 DIAGNOSIS — M7989 Other specified soft tissue disorders: Secondary | ICD-10-CM

## 2023-11-03 MED ORDER — TRAZODONE HCL 50 MG PO TABS: 25.0000 mg | ORAL_TABLET | Freq: Every evening | ORAL | 3 refills | Status: DC | PRN

## 2023-11-03 MED ORDER — ENSURE ACTIVE HIGH PROTEIN PO LIQD: 1.0000 | Freq: Every day | ORAL | 3 refills | Status: AC
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
## 2023-11-11 ENCOUNTER — Ambulatory Visit: Payer: Medicare Other | Admitting: Physician Assistant

## 2023-11-11 ENCOUNTER — Encounter: Payer: Self-pay | Admitting: Physician Assistant

## 2023-11-11 ENCOUNTER — Other Ambulatory Visit (INDEPENDENT_AMBULATORY_CARE_PROVIDER_SITE_OTHER): Payer: Self-pay

## 2023-11-11 DIAGNOSIS — M79641 Pain in right hand: Secondary | ICD-10-CM
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
## 2023-12-02 ENCOUNTER — Ambulatory Visit: Payer: Medicare Other | Admitting: Occupational Therapy

## 2023-12-03 ENCOUNTER — Encounter: Payer: Medicare Other | Admitting: Family

## 2023-12-06 NOTE — Progress Notes (Signed)
  This encounter was created in error - please disregard. No show 

## 2023-12-07 ENCOUNTER — Other Ambulatory Visit: Payer: Self-pay

## 2023-12-07 ENCOUNTER — Ambulatory Visit: Payer: Medicare Other | Attending: Physician Assistant | Admitting: Occupational Therapy

## 2023-12-07 ENCOUNTER — Encounter: Payer: Self-pay | Admitting: Occupational Therapy

## 2023-12-07 DIAGNOSIS — M6281 Muscle weakness (generalized): Secondary | ICD-10-CM | POA: Diagnosis present

## 2023-12-07 DIAGNOSIS — R278 Other lack of coordination: Secondary | ICD-10-CM

## 2023-12-07 DIAGNOSIS — R29898 Other symptoms and signs involving the musculoskeletal system: Secondary | ICD-10-CM | POA: Diagnosis present

## 2023-12-07 DIAGNOSIS — M79641 Pain in right hand: Secondary | ICD-10-CM | POA: Diagnosis not present

## 2023-12-07 DIAGNOSIS — M79644 Pain in right finger(s): Secondary | ICD-10-CM | POA: Diagnosis present
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
## 2023-12-16 ENCOUNTER — Ambulatory Visit: Payer: Medicare Other | Admitting: Occupational Therapy

## 2023-12-23 ENCOUNTER — Ambulatory Visit: Payer: Medicare Other | Admitting: Occupational Therapy

## 2023-12-28 ENCOUNTER — Ambulatory Visit (INDEPENDENT_AMBULATORY_CARE_PROVIDER_SITE_OTHER): Payer: Medicare Other | Admitting: Family

## 2023-12-28 ENCOUNTER — Encounter: Payer: Self-pay | Admitting: Family

## 2023-12-28 VITALS — BP 132/82 | HR 72 | Temp 97.8°F | Resp 18 | Ht 62.0 in | Wt 90.2 lb

## 2023-12-28 DIAGNOSIS — R432 Parageusia: Secondary | ICD-10-CM | POA: Diagnosis not present

## 2023-12-28 DIAGNOSIS — R630 Anorexia: Secondary | ICD-10-CM | POA: Insufficient documentation

## 2023-12-28 DIAGNOSIS — R634 Abnormal weight loss: Secondary | ICD-10-CM | POA: Insufficient documentation

## 2023-12-28 DIAGNOSIS — K219 Gastro-esophageal reflux disease without esophagitis: Secondary | ICD-10-CM | POA: Diagnosis not present

## 2023-12-28 MED ORDER — OMEPRAZOLE 20 MG PO CPDR: 20.0000 mg | DELAYED_RELEASE_CAPSULE | Freq: Two times a day (BID) | ORAL | 1 refills | Status: AC | PRN

## 2023-12-28 MED ORDER — MIRTAZAPINE 7.5 MG PO TABS: 7.5000 mg | ORAL_TABLET | Freq: Every day | ORAL | 3 refills | Status: DC
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
## 2023-12-30 ENCOUNTER — Ambulatory Visit: Payer: Medicare Other | Admitting: Occupational Therapy

## 2023-12-30 ENCOUNTER — Telehealth: Payer: Self-pay | Admitting: Occupational Therapy

## 2023-12-30 NOTE — Telephone Encounter (Signed)
Pt canceled today's OT appointment within 24 hours, which is third cancel within 24 hours. Pt not seen since 12/06/24 and initial OT POC scheduled to end on 01/07/24. Therefore, OT called pt's listed mobile phone number [phone]). OT left voicemail reminding pt of next OT appointment date/time. OT provided update that new referral will be required if pt does not arrive for next appointment per clinic no-show/cancel policy.

## 2024-01-06 ENCOUNTER — Encounter: Payer: Self-pay | Admitting: Occupational Therapy

## 2024-01-06 ENCOUNTER — Ambulatory Visit: Payer: Medicare Other | Admitting: Occupational Therapy

## 2024-01-06 DIAGNOSIS — M79644 Pain in right finger(s): Secondary | ICD-10-CM

## 2024-01-06 DIAGNOSIS — M6281 Muscle weakness (generalized): Secondary | ICD-10-CM

## 2024-01-06 DIAGNOSIS — R278 Other lack of coordination: Secondary | ICD-10-CM

## 2024-01-06 DIAGNOSIS — R29898 Other symptoms and signs involving the musculoskeletal system: Secondary | ICD-10-CM

## 2024-01-06 NOTE — Therapy (Signed)
[facility] [address] [place], [place], [address] Phone: [phone]   Fax:  [phone]  Patient Details  Name: Brittany Burns MRN: [id number] Date of Birth: 11/28/1956  Patient's treating therapist from today reached out regarding splint information.  Patient did not wait for feedback but I wanted to leave this information for patient/medical team to access.    This OTR did reach out to an orthotist regarding ordering a resting hand splint through insurance.  Patient did not return to therapy to discuss options this month as she would have a Medicare deductible of $257 to meet at the beginning of this year before insurance would cover the splint.  It may have been less expensive for patient to purchase the item online rather than pay the deductible as the same splint could be found for $55 on [employer].  Another option would have been to custom make a thermoplastic splint in the clinic.  This therapist did feel patient would benefit from a prefab hand splint for maximum comfort. Recommend prefab vs custom due to chronicity of joint contractures, adjustability, comfort, and ease of cleaning.    We are happy to see patient again if further needs arise and we can help with obtaining/fabricating  appropriate splint.  A new OT referral would be required for additional OT services.   Victorino Sparrow, OT 01/06/2024, 4:53 PM  [facility] [address] [place], [place], [address] Phone: [phone]   Fax:  [phone]
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
## 2024-02-01 ENCOUNTER — Encounter: Payer: 59 | Admitting: Family

## 2024-02-01 NOTE — Progress Notes (Signed)
   This encounter was created in error - please disregard. No show

## 2024-02-10 ENCOUNTER — Institutional Professional Consult (permissible substitution) (INDEPENDENT_AMBULATORY_CARE_PROVIDER_SITE_OTHER): Payer: 59 | Admitting: Otolaryngology

## 2024-05-02 ENCOUNTER — Other Ambulatory Visit: Payer: Self-pay | Admitting: Family

## 2024-05-02 DIAGNOSIS — I10 Essential (primary) hypertension: Secondary | ICD-10-CM

## 2024-08-13 ENCOUNTER — Emergency Department (HOSPITAL_COMMUNITY)
Admission: EM | Admit: 2024-08-13 | Discharge: 2024-08-13 | Discharge status: Against Medical Advice | Attending: Emergency Medicine | Admitting: Emergency Medicine

## 2024-08-13 DIAGNOSIS — R634 Abnormal weight loss: Secondary | ICD-10-CM | POA: Insufficient documentation

## 2024-08-13 DIAGNOSIS — Z5321 Procedure and treatment not carried out due to patient leaving prior to being seen by health care provider: Secondary | ICD-10-CM | POA: Diagnosis not present

## 2024-08-13 DIAGNOSIS — R630 Anorexia: Secondary | ICD-10-CM | POA: Diagnosis present
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
## 2024-08-13 NOTE — ED Notes (Signed)
 Pt seen by lobby staff arguing with her visitor as she walked out of the ED.

## 2024-09-25 ENCOUNTER — Other Ambulatory Visit: Payer: Self-pay

## 2024-09-25 ENCOUNTER — Emergency Department (HOSPITAL_COMMUNITY)
Admission: EM | Admit: 2024-09-25 | Discharge: 2024-09-25 | Disposition: A | Discharge status: Home / Self Care | Attending: Emergency Medicine | Admitting: Emergency Medicine

## 2024-09-25 ENCOUNTER — Encounter (HOSPITAL_COMMUNITY): Payer: Self-pay

## 2024-09-25 DIAGNOSIS — L259 Unspecified contact dermatitis, unspecified cause: Secondary | ICD-10-CM | POA: Insufficient documentation

## 2024-09-25 DIAGNOSIS — Z7982 Long term (current) use of aspirin: Secondary | ICD-10-CM | POA: Insufficient documentation

## 2024-09-25 DIAGNOSIS — Z79899 Other long term (current) drug therapy: Secondary | ICD-10-CM | POA: Insufficient documentation

## 2024-09-25 DIAGNOSIS — I10 Essential (primary) hypertension: Secondary | ICD-10-CM | POA: Diagnosis not present

## 2024-09-25 DIAGNOSIS — R21 Rash and other nonspecific skin eruption: Secondary | ICD-10-CM | POA: Diagnosis present

## 2024-09-25 MED ORDER — HYDROCORTISONE 1 % EX CREA: TOPICAL_CREAM | CUTANEOUS | 0 refills | Status: DC

## 2024-09-25 NOTE — ED Provider Notes (Signed)
 [place] EMERGENCY DEPARTMENT AT [facility] Provider Note   CSN: [id number] Arrival date & time: 09/25/24  1526     Patient presents with: Rash and Decreased appetite    Brittany Burns is a 68 y.o. female history of hypertension, presents with concern for some redness to skin above her upper lip that began couple days ago.  She is unsure if any new medications, soaps, lotions, or foods.  She has been trying to apply antibiotic ointment to the area with mild improvement in her symptoms.  She denies any swelling of her lips, tongue, or posterior oropharynx.  No shortness of breath or difficulty with breathing.  No other rash.    Rash      Prior to Admission medications   Medication Sig Start Date End Date Taking? Authorizing Provider  hydrocortisone cream 1 % Apply to affected area once daily for the next 5 days 09/25/24  Yes Veta Palma, PA-C  amLODipine  (NORVASC ) 10 MG tablet TAKE 1 TABLET(10 MG) BY MOUTH DAILY 05/03/24   Ngetich, Dinah C, NP  aspirin  EC 81 MG tablet Take 1 tablet (81 mg total) by mouth daily. Swallow whole. 04/21/23   Ngetich, Dinah C, NP  mirtazapine  (REMERON ) 7.5 MG tablet Take 1 tablet (7.5 mg total) by mouth at bedtime. 12/28/23   Ngetich, Dinah C, NP  omeprazole  (PRILOSEC) 20 MG capsule Take 1 capsule (20 mg total) by mouth 2 (two) times daily as needed. 12/28/23   Ngetich, Dinah C, NP  simvastatin  (ZOCOR ) 20 MG tablet Take 1 tablet (20 mg total) by mouth every evening. 04/21/23   Ngetich, Dinah C, NP  traZODone  (DESYREL ) 50 MG tablet Take 0.5-1 tablets (25-50 mg total) by mouth at bedtime as needed for sleep. 11/03/23   Ngetich, Dinah C, NP    Allergies: Patient has no known allergies.    Review of Systems  Skin:  Positive for rash.    Updated Vital Signs BP 114/75   Pulse 99   Temp 98.4 F (36.9 C)   Resp 16   SpO2 94%   Physical Exam Vitals and nursing note reviewed.  Constitutional:      Appearance: Normal appearance.   HENT:     Head: Atraumatic.      Comments: Philtrum with mild erythema and appears slightly dry.  No appreciated rash.  No edema of the lips, tongue, posterior oropharynx. Cardiovascular:     Rate and Rhythm: Normal rate and regular rhythm.  Pulmonary:     Effort: Pulmonary effort is normal.     Breath sounds: Normal breath sounds.     Comments: Lungs clear to auscultation bilaterally Neurological:     General: No focal deficit present.     Mental Status: She is alert.  Psychiatric:        Mood and Affect: Mood normal.        Behavior: Behavior normal.     (all labs ordered are listed, but only abnormal results are displayed) Labs Reviewed - No data to display  EKG: None  Radiology: No results found.   Procedures   Medications Ordered in the ED - No data to display                                  Medical Decision Making    Differential diagnosis includes but is not limited to contact dermatitis, SJS, eczema, angioedema, anaphylaxis  ED Course:  Upon  initial evaluation, patient is very well-appearing, no acute distress.  Stable vitals.  She is reporting a rash to the upper lip.  On exam, I do not appreciate a rash, but the philtrum does appear slightly erythematous and dry.  It is not appear infectious.  She denies applying any new lotions, Chapstick's, no new foods or medications. However, it clinically appears consistent with a contact dermatitis.  She has been applying a triple antibiotic cream to this area with some improvement in her symptoms.  No blistering or peeling of the skin on the face or lips, no concern for SJS.  No edema of the lips or tongue, no concern for angioedema.  She denies any shortness of breath, no rash, no hypotension, no signs of anaphylaxis.  Patient stable and appropriate for discharge home.  Impression: Contact dermatitis  Disposition:  The patient was discharged home with instructions to use hydrocortisone cream prescribed to the  philtrum once daily for next 5 days.  May continue using Vaseline on the area top with moisturization.  Follow-up with PCP within the next week for recheck of symptoms. Return precautions given and patient verbalized understanding.   This chart was dictated using voice recognition software, Dragon. Despite the best efforts of this provider to proofread and correct errors, errors may still occur which can change documentation meaning.       Final diagnoses:  Contact dermatitis, unspecified contact dermatitis type, unspecified trigger    ED Discharge Orders          Ordered    hydrocortisone cream 1 %        09/25/24 1620               Veta Palma, PA-C 09/25/24 1628    Yolande Lamar BROCKS, MD 10/02/24 [phone]

## 2024-09-25 NOTE — ED Triage Notes (Signed)
 PT comes in for soreness around her mouth and having no appetite. PT is A&O and in no obvious distress.

## 2024-09-25 NOTE — Discharge Instructions (Addendum)
 You may apply the hydrocortisone cream prescribed to the skin above the upper lip once daily for the next 5 days.  Do not use this for longer than prescribed as it can cause skin thinning and can cause discoloration.  You can continue to apply Vaseline or the antibiotic ointment you have to help to moisturize the skin.  Please follow-up with your PCP within the next week for recheck of symptoms.  Please return to the ER for any blistering or peeling of your lips, lip swelling, shortness of breath, fevers, worsening rash, any other new or concerning symptoms

## 2024-10-04 ENCOUNTER — Encounter: Payer: Self-pay | Admitting: Family

## 2024-10-04 ENCOUNTER — Ambulatory Visit: Admitting: Family

## 2024-10-04 VITALS — BP 116/54 | HR 83 | Temp 97.8°F | Ht 62.0 in | Wt 86.0 lb

## 2024-10-04 DIAGNOSIS — Z1231 Encounter for screening mammogram for malignant neoplasm of breast: Secondary | ICD-10-CM | POA: Diagnosis not present

## 2024-10-04 DIAGNOSIS — R630 Anorexia: Secondary | ICD-10-CM

## 2024-10-04 DIAGNOSIS — E785 Hyperlipidemia, unspecified: Secondary | ICD-10-CM

## 2024-10-04 DIAGNOSIS — I10 Essential (primary) hypertension: Secondary | ICD-10-CM

## 2024-10-04 DIAGNOSIS — I700 Atherosclerosis of aorta: Secondary | ICD-10-CM | POA: Diagnosis not present

## 2024-10-04 DIAGNOSIS — R634 Abnormal weight loss: Secondary | ICD-10-CM

## 2024-10-04 MED ORDER — ASPIRIN 81 MG PO TBEC: 81.0000 mg | DELAYED_RELEASE_TABLET | Freq: Every day | ORAL | 12 refills | Status: AC

## 2024-10-04 MED ORDER — MIRTAZAPINE 7.5 MG PO TABS: 7.5000 mg | ORAL_TABLET | Freq: Every day | ORAL | 3 refills | Status: DC

## 2024-10-04 NOTE — Patient Instructions (Signed)
 Stop at the front desk to schedule your annual wellness visit.
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
## 2024-10-26 ENCOUNTER — Ambulatory Visit (HOSPITAL_COMMUNITY): Admission: EM | Admit: 2024-10-26 | Discharge: 2024-10-26 | Discharge status: Against Medical Advice

## 2024-10-26 NOTE — ED Notes (Signed)
 Per Patient Access, pt stated they would return tomorrow due to wait

## 2024-11-03 ENCOUNTER — Encounter (HOSPITAL_COMMUNITY): Payer: Self-pay | Admitting: Emergency Medicine

## 2024-11-03 ENCOUNTER — Ambulatory Visit (HOSPITAL_COMMUNITY)
Admission: EM | Admit: 2024-11-03 | Discharge: 2024-11-03 | Disposition: A | Discharge status: Home / Self Care | Attending: Emergency Medicine | Admitting: Emergency Medicine

## 2024-11-03 DIAGNOSIS — L089 Local infection of the skin and subcutaneous tissue, unspecified: Secondary | ICD-10-CM | POA: Diagnosis not present

## 2024-11-03 DIAGNOSIS — L710 Perioral dermatitis: Secondary | ICD-10-CM

## 2024-11-03 DIAGNOSIS — B9689 Other specified bacterial agents as the cause of diseases classified elsewhere: Secondary | ICD-10-CM

## 2024-11-03 MED ORDER — CEPHALEXIN 500 MG PO CAPS: 500.0000 mg | ORAL_CAPSULE | Freq: Two times a day (BID) | ORAL | 0 refills | Status: AC

## 2024-11-03 MED ORDER — MUPIROCIN 2 % EX OINT: 1.0000 | TOPICAL_OINTMENT | Freq: Two times a day (BID) | CUTANEOUS | 0 refills | Status: DC
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
## 2024-11-03 NOTE — ED Triage Notes (Addendum)
 Patient has rash around her mouth that is burning and itching ,  she has been to the emergency room, primary care and the hydrocortisone  cream 1% is not working

## 2024-11-03 NOTE — Discharge Instructions (Signed)
 Stop applying hydrocortisone  and start applying mupirocin ointment twice daily.  You can continue to apply Vaseline in between this as well. Start taking cephalexin  twice daily for 7 days as well to cover for bacterial skin infection. I have attached information for dermatology that you can follow-up with if your symptoms continue. Follow-up with your primary care provider or return here as needed.

## 2024-11-10 ENCOUNTER — Emergency Department (HOSPITAL_COMMUNITY)
Admission: EM | Admit: 2024-11-10 | Discharge: 2024-11-11 | Attending: Emergency Medicine | Admitting: Emergency Medicine

## 2024-11-10 ENCOUNTER — Encounter (HOSPITAL_COMMUNITY): Payer: Self-pay

## 2024-11-10 ENCOUNTER — Other Ambulatory Visit: Payer: Self-pay

## 2024-11-10 DIAGNOSIS — Z5321 Procedure and treatment not carried out due to patient leaving prior to being seen by health care provider: Secondary | ICD-10-CM | POA: Insufficient documentation

## 2024-11-10 DIAGNOSIS — R21 Rash and other nonspecific skin eruption: Secondary | ICD-10-CM | POA: Insufficient documentation

## 2024-11-10 NOTE — ED Triage Notes (Signed)
 Pt came in via POV d/t the last 3 weeks having a rash around her mouth & inside of it. Pt states she no longer has the rash in her mouth that it is only around the outside of her mouth now & it itches/burns. Has been seen for this before & is back for re-eval.

## 2024-11-10 NOTE — ED Notes (Signed)
 Called pt 3x for vitals, and received no response.

## 2024-11-11 ENCOUNTER — Other Ambulatory Visit: Payer: Self-pay

## 2024-11-11 ENCOUNTER — Emergency Department (HOSPITAL_COMMUNITY): Admission: EM | Admit: 2024-11-11 | Discharge: 2024-11-11 | Disposition: A | Discharge status: Home / Self Care

## 2024-11-11 ENCOUNTER — Encounter (HOSPITAL_COMMUNITY): Payer: Self-pay

## 2024-11-11 DIAGNOSIS — Z7982 Long term (current) use of aspirin: Secondary | ICD-10-CM | POA: Insufficient documentation

## 2024-11-11 DIAGNOSIS — L710 Perioral dermatitis: Secondary | ICD-10-CM | POA: Insufficient documentation

## 2024-11-11 DIAGNOSIS — R21 Rash and other nonspecific skin eruption: Secondary | ICD-10-CM | POA: Diagnosis not present

## 2024-11-11 NOTE — ED Triage Notes (Signed)
 Pt here again to be seen for facial rash.  Airway patent.

## 2024-11-11 NOTE — ED Provider Notes (Signed)
 [place] EMERGENCY DEPARTMENT AT [facility] Provider Note   CSN: [id number] Arrival date & time: 11/11/24  1501     Patient presents with: Rash   Brittany Burns is a 68 y.o. female.   Rash Patient is a 68 year old female presenting ED today for concerns for perioral rash that has been ongoing since October, seen twice in the emergency department once by PCP for same complaint.  Taking hydrocortisone  1%, Keflex , bacitracin.  Noting that the rash has been significantly improved but came in today because she did not wish to go back and see her PCP.  Reports that she has no rashes anywhere else.  States that it is still mildly burning and was concerned because she is almost finished with antibiotics.  Notes that she endorses some itchy eyes that started today.  Denies fever, headache, vision changes, odynophagia, otalgia, vertigo, intraoral lesions, dental pain.     Prior to Admission medications   Medication Sig Start Date End Date Taking? Authorizing Provider  amLODipine  (NORVASC ) 10 MG tablet TAKE 1 TABLET(10 MG) BY MOUTH DAILY 05/03/24   Ngetich, Dinah C, NP  aspirin  EC 81 MG tablet Take 1 tablet (81 mg total) by mouth daily. Swallow whole. 10/04/24   Ngetich, Dinah C, NP  hydrocortisone  cream 1 % Apply to affected area once daily for the next 5 days 09/25/24   Veta Palma, PA-C  mupirocin  ointment (BACTROBAN ) 2 % Apply 1 Application topically 2 (two) times daily. 11/03/24   Johnie Flaming A, NP  omeprazole  (PRILOSEC) 20 MG capsule Take 1 capsule (20 mg total) by mouth 2 (two) times daily as needed. 12/28/23   Ngetich, Dinah C, NP    Allergies: Patient has no known allergies.    Review of Systems  Skin:  Positive for rash.  All other systems reviewed and are negative.   Updated Vital Signs BP 121/78 (BP Location: Right Arm)   Pulse 86   Temp 98.7 F (37.1 C)   Resp 14   Ht 5' 1 (1.549 m)   SpO2 96%   BMI 16.25 kg/m   Physical Exam Vitals and  nursing note reviewed.  Constitutional:      General: She is not in acute distress.    Appearance: Normal appearance. She is not ill-appearing or diaphoretic.  HENT:     Head: Normocephalic and atraumatic.     Mouth/Throat:     Mouth: Mucous membranes are moist.     Pharynx: Oropharynx is clear. No oropharyngeal exudate or posterior oropharyngeal erythema.  Eyes:     General: No scleral icterus.       Right eye: No discharge.        Left eye: No discharge.     Extraocular Movements: Extraocular movements intact.     Conjunctiva/sclera: Conjunctivae normal.     Pupils: Pupils are equal, round, and reactive to light.  Cardiovascular:     Rate and Rhythm: Normal rate and regular rhythm.     Pulses: Normal pulses.     Heart sounds: Normal heart sounds. No murmur heard.    No friction rub. No gallop.  Pulmonary:     Effort: Pulmonary effort is normal. No respiratory distress.     Breath sounds: No stridor. No wheezing, rhonchi or rales.  Chest:     Chest wall: No tenderness.  Abdominal:     General: Abdomen is flat. There is no distension.     Palpations: Abdomen is soft.     Tenderness: There  is no abdominal tenderness. There is no right CVA tenderness, left CVA tenderness, guarding or rebound.  Musculoskeletal:        General: No swelling, deformity or signs of injury.     Cervical back: Normal range of motion. No rigidity.     Right lower leg: No edema.     Left lower leg: No edema.  Skin:    General: Skin is warm and dry.     Findings: Rash (Noted to have mild perioral erythematous rash, with dry skin and flaking present.) present. No bruising, erythema or lesion.  Neurological:     General: No focal deficit present.     Mental Status: She is alert and oriented to person, place, and time. Mental status is at baseline.     Sensory: No sensory deficit.     Motor: No weakness.  Psychiatric:        Mood and Affect: Mood normal.     (all labs ordered are listed, but only  abnormal results are displayed) Labs Reviewed - No data to display  EKG: None  Radiology: No results found.  Procedures   Medications Ordered in the ED - No data to display  Medical Decision Making   This patient is a 69 year old female who presents to the ED for concern of perioral rash as an ongoing since late October, seen 3 times, twice in the ED and once by PCP for same complaint.  Noted to have had improved symptoms but came today because it was still burning.  On physical exam, patient is in no acute distress, afebrile, alert and orient x 4, speaking in full sentences, nontachypneic, nontachycardic.  Rash appears to be significantly improved periorally from when compared to previous pictures in November.  Dry flaky skin is noted.  With no intraoral lesions present.  No lesions noted otherwise unremarkable exam otherwise.  Patient overall well-appearing.  Low suspicion for any emergent pathology present as time.  Suspecting likely cheilitis as cause of patient's complaints today.  She is not using any barrier creams and has only been using steroid creams, antibiotics.  Could be rosacea which is resolving with antibiotics, will have her continue to finish course.  Will have her follow-up with ENT as well as PCP, with her having persistent symptoms to be evaluated for possible Sjogren's.  Suspicion for any infectious etiology at this time.  Patient vital signs have remained stable throughout the course of patient's time in the ED. Low suspicion for any other emergent pathology at this time. I believe this patient is safe to be discharged. Provided strict return to ER precautions. Patient expressed agreement and understanding of plan. All questions were answered.  Differential diagnoses prior to evaluation: The emergent differential diagnosis includes, but is not limited to, rosacea, cellulitis, Sjogren syndrome, cheilitis, xerostomia. This is not an exhaustive differential.   Past  Medical History / Co-morbidities / Social History: GERD, CVA, HLD, HTN  Additional history: Chart reviewed. Pertinent results include:   Seen 3 times since 09/25/2024 for perioral lesions.  Seen twice in the emergency department and once by PCP.  PCP was going to refer to ENT if symptoms persist.  Having been provided hydrocortisone  cream, Keflex  and Bactroban  in the emergency department, last seen 11/03/2024.    Medications:   I have reviewed the patients home medicines and have made adjustments as needed.  Critical Interventions: None  Social Determinants of Health: Has good follow-up with PCP  Disposition: After consideration of the diagnostic results and  the patients response to treatment, I feel that the patient would benefit from discharge and treatment as above.   emergency department workup does not suggest an emergent condition requiring admission or immediate intervention beyond what has been performed at this time. The plan is: Follow-up with PCP, follow-up with ENT, return to ER for new or worsening symptoms, barrier creams. The patient is safe for discharge and has been instructed to return immediately for worsening symptoms, change in symptoms or any other concerns.  Final diagnoses:  Perioral dermatitis    ED Discharge Orders     None          Beola Terrall GORMAN DEVONNA 11/11/24 1729    Ula Prentice SAUNDERS, MD 11/11/24 2214

## 2024-11-11 NOTE — ED Triage Notes (Signed)
 C/O rash around mouth for 3 weeks. No swelling or difficulty swallowing

## 2024-11-11 NOTE — Discharge Instructions (Signed)
 You were seen today for rash around your mouth has been bothering you since October.  Suspect this is likely due to dry skin that is causing the burning sensation and flaking that you have been experiencing.  Be sure to continue finishing your antibiotics as with it currently clearing up with antibiotics.  Please be sure to go to any pharmacy and pick up a petroleum based barrier cream such as Aquaphor, applying it at least 4 times a day for the next week or 2, noting that it will take about a week or 2 for you to start seeing results as your skin rehydrates.  If symptoms persist, please reach out to your PCP as well as the ENT that I am placing here in this after visit summary to be evaluated further.

## 2024-11-13 ENCOUNTER — Telehealth: Payer: Self-pay | Admitting: *Deleted

## 2024-11-13 DIAGNOSIS — R21 Rash and other nonspecific skin eruption: Secondary | ICD-10-CM

## 2024-11-13 NOTE — Telephone Encounter (Signed)
Dermatology referral ordered as requested

## 2024-11-13 NOTE — Telephone Encounter (Signed)
 Copied from CRM [id number]. Topic: Referral - Request for Referral >> Nov 13, 2024  2:49 PM Debby BROCKS wrote: Did the patient discuss referral with their provider in the last year? Yes (If No - schedule appointment) (If Yes - send message)  Appointment offered? No  Type of order/referral and detailed reason for visit: Patient states she spoke about it with Dinah on their appointment 3 weeks ago. She has a rash on her mouth that has not gone away. UC and ED have not treated it and she wants to see a dermatologist that's accepted by medicaid.  Preference of office, provider, location: N/A  If referral order, have you been seen by this specialty before? No (If Yes, this issue or another issue? When? Where?  Can we respond through MyChart? Yes

## 2024-11-19 ENCOUNTER — Other Ambulatory Visit: Payer: Self-pay

## 2024-11-19 ENCOUNTER — Emergency Department (HOSPITAL_COMMUNITY)
Admission: EM | Admit: 2024-11-19 | Discharge: 2024-11-19 | Disposition: A | Discharge status: Home / Self Care | Attending: Emergency Medicine | Admitting: Emergency Medicine

## 2024-11-19 DIAGNOSIS — R21 Rash and other nonspecific skin eruption: Secondary | ICD-10-CM

## 2024-11-19 MED ORDER — PREDNISONE 10 MG (21) PO TBPK: ORAL_TABLET | Freq: Every day | ORAL | 0 refills | Status: AC

## 2024-11-19 NOTE — ED Provider Notes (Signed)
 [place] EMERGENCY DEPARTMENT AT [facility] Provider Note   CSN: [id number] Arrival date & time: 11/19/24  1600     Patient presents with: Rash   Brittany Burns is a 68 y.o. female.   68 year old female presents with persistent rash around her mouth for several months.  Is currently taking hydrocortisone  cream states it has somewhat improved but still not completely resolved.  It does itch.  Denies any chemical exposures.  Denies any trouble swallowing.  She is not short of breath.  Has no other rashes on her body.  No recent antibiotics.       Prior to Admission medications  Medication Sig Start Date End Date Taking? Authorizing Provider  amLODipine  (NORVASC ) 10 MG tablet TAKE 1 TABLET(10 MG) BY MOUTH DAILY 05/03/24   Ngetich, Dinah C, NP  aspirin  EC 81 MG tablet Take 1 tablet (81 mg total) by mouth daily. Swallow whole. 10/04/24   Ngetich, Dinah C, NP  hydrocortisone  cream 1 % Apply to affected area once daily for the next 5 days 09/25/24   Veta Palma, PA-C  mupirocin  ointment (BACTROBAN ) 2 % Apply 1 Application topically 2 (two) times daily. 11/03/24   Johnie Flaming A, NP  omeprazole  (PRILOSEC) 20 MG capsule Take 1 capsule (20 mg total) by mouth 2 (two) times daily as needed. 12/28/23   Ngetich, Dinah C, NP    Allergies: Patient has no known allergies.    Review of Systems  All other systems reviewed and are negative.   Updated Vital Signs BP (!) 131/95 (BP Location: Left Arm)   Pulse 88   Temp 97.7 F (36.5 C) (Oral)   Resp 17   SpO2 100%   Physical Exam Vitals and nursing note reviewed.  Constitutional:      General: She is not in acute distress.    Appearance: Normal appearance. She is well-developed. She is not toxic-appearing.  HENT:     Head: Normocephalic and atraumatic.     Mouth/Throat:     Comments: Slight perioral erythema without blisters or pustules noted.  No intraoral involvement. Eyes:     General: Lids are normal.      Conjunctiva/sclera: Conjunctivae normal.     Pupils: Pupils are equal, round, and reactive to light.  Neck:     Thyroid: No thyroid mass.     Trachea: No tracheal deviation.  Cardiovascular:     Rate and Rhythm: Normal rate and regular rhythm.     Heart sounds: Normal heart sounds. No murmur heard.    No gallop.  Pulmonary:     Effort: Pulmonary effort is normal. No respiratory distress.     Breath sounds: Normal breath sounds. No stridor. No decreased breath sounds, wheezing, rhonchi or rales.  Abdominal:     General: There is no distension.     Palpations: Abdomen is soft.     Tenderness: There is no abdominal tenderness. There is no rebound.  Musculoskeletal:        General: No tenderness. Normal range of motion.     Cervical back: Normal range of motion and neck supple.  Skin:    General: Skin is warm and dry.     Findings: No abrasion or rash.  Neurological:     Mental Status: She is alert and oriented to person, place, and time. Mental status is at baseline.     GCS: GCS eye subscore is 4. GCS verbal subscore is 5. GCS motor subscore is 6.  Cranial Nerves: No cranial nerve deficit.     Sensory: No sensory deficit.     Motor: Motor function is intact.  Psychiatric:        Attention and Perception: Attention normal.        Speech: Speech normal.        Behavior: Behavior normal.     (all labs ordered are listed, but only abnormal results are displayed) Labs Reviewed - No data to display  EKG: None  Radiology: No results found.   Procedures   Medications Ordered in the ED - No data to display                                  Medical Decision Making  Patient with erythematous rash.  No concern for Stevens-Johnson's.  Will place on prednisone .  Patient to follow-up with a dermatologist     Final diagnoses:  None    ED Discharge Orders     None          Dasie Faden, MD 11/19/24 1704

## 2024-11-19 NOTE — ED Triage Notes (Signed)
 PT ambulatory to triage with complaints of a persistent rash around her mouth. PT states that she has been seen multiple times for the rash, and it has not resolved.

## 2024-11-19 NOTE — Discharge Instructions (Signed)
 Use Benadryl as needed for itching.  Follow-up with a dermatologist of your choice.

## 2024-11-20 ENCOUNTER — Telehealth: Payer: Self-pay

## 2024-11-20 NOTE — Telephone Encounter (Signed)
 Left message on voicemail for patient to return call when available

## 2024-11-20 NOTE — Telephone Encounter (Signed)
 Copied from CRM #[id number]. Topic: General - Other >> Nov 17, 2024  4:29 PM Miquel SAILOR wrote: Reason for CRM: PT very irate and would not tell me what she needs due to she said I did not help her the last time. Tried calling office n/a. Let Pt know office not available and she hung up on me. Nees call back t find out what she needs  [phone]

## 2024-11-20 NOTE — Telephone Encounter (Signed)
 Spoke with patient, patient states she was calling for a rash, however she went to the emergency room and the rash has completely cleared up and she has no needs at this time

## 2024-11-28 ENCOUNTER — Emergency Department (HOSPITAL_COMMUNITY)

## 2024-11-28 ENCOUNTER — Emergency Department (HOSPITAL_COMMUNITY)
Admission: EM | Admit: 2024-11-28 | Discharge: 2024-11-28 | Discharge status: Against Medical Advice | Attending: Emergency Medicine | Admitting: Emergency Medicine

## 2024-11-28 ENCOUNTER — Other Ambulatory Visit: Payer: Self-pay

## 2024-11-28 DIAGNOSIS — Z87891 Personal history of nicotine dependence: Secondary | ICD-10-CM | POA: Insufficient documentation

## 2024-11-28 DIAGNOSIS — Z7982 Long term (current) use of aspirin: Secondary | ICD-10-CM | POA: Insufficient documentation

## 2024-11-28 DIAGNOSIS — I10 Essential (primary) hypertension: Secondary | ICD-10-CM | POA: Diagnosis not present

## 2024-11-28 DIAGNOSIS — E871 Hypo-osmolality and hyponatremia: Secondary | ICD-10-CM | POA: Diagnosis not present

## 2024-11-28 DIAGNOSIS — R059 Cough, unspecified: Secondary | ICD-10-CM | POA: Diagnosis present

## 2024-11-28 DIAGNOSIS — Z79899 Other long term (current) drug therapy: Secondary | ICD-10-CM | POA: Diagnosis not present

## 2024-11-28 DIAGNOSIS — J9601 Acute respiratory failure with hypoxia: Secondary | ICD-10-CM | POA: Diagnosis not present

## 2024-11-28 DIAGNOSIS — E876 Hypokalemia: Secondary | ICD-10-CM | POA: Diagnosis not present

## 2024-11-28 DIAGNOSIS — J101 Influenza due to other identified influenza virus with other respiratory manifestations: Secondary | ICD-10-CM | POA: Insufficient documentation

## 2024-11-28 LAB — CBC WITH DIFFERENTIAL/PLATELET
Abs Immature Granulocytes: 0.02 K/uL (ref 0.00–0.07)
Basophils Absolute: 0 K/uL (ref 0.0–0.1)
Basophils Relative: 0 %
Eosinophils Absolute: 0 K/uL (ref 0.0–0.5)
Eosinophils Relative: 0 %
HCT: 42.6 % (ref 36.0–46.0)
Hemoglobin: 14.7 g/dL (ref 12.0–15.0)
Immature Granulocytes: 0 %
Lymphocytes Relative: 8 %
Lymphs Abs: 0.4 K/uL — ABNORMAL LOW (ref 0.7–4.0)
MCH: 33 pg (ref 26.0–34.0)
MCHC: 34.5 g/dL (ref 30.0–36.0)
MCV: 95.7 fL (ref 80.0–100.0)
Monocytes Absolute: 0.1 K/uL (ref 0.1–1.0)
Monocytes Relative: 2 %
Neutro Abs: 4.7 K/uL (ref 1.7–7.7)
Neutrophils Relative %: 90 %
Platelets: 181 K/uL (ref 150–400)
RBC: 4.45 MIL/uL (ref 3.87–5.11)
RDW: 13.9 % (ref 11.5–15.5)
WBC: 5.3 K/uL (ref 4.0–10.5)
nRBC: 0 % (ref 0.0–0.2)

## 2024-11-28 LAB — RESP PANEL BY RT-PCR (RSV, FLU A&B, COVID)  RVPGX2
Influenza A by PCR: POSITIVE — AB
Influenza B by PCR: NEGATIVE
Resp Syncytial Virus by PCR: NEGATIVE
SARS Coronavirus 2 by RT PCR: NEGATIVE

## 2024-11-28 LAB — COMPREHENSIVE METABOLIC PANEL WITH GFR
ALT: 19 U/L (ref 0–44)
AST: 33 U/L (ref 15–41)
Albumin: 4.1 g/dL (ref 3.5–5.0)
Alkaline Phosphatase: 70 U/L (ref 38–126)
Anion gap: 12 (ref 5–15)
BUN: 9 mg/dL (ref 8–23)
CO2: 26 mmol/L (ref 22–32)
Calcium: 8.4 mg/dL — ABNORMAL LOW (ref 8.9–10.3)
Chloride: 96 mmol/L — ABNORMAL LOW (ref 98–111)
Creatinine, Ser: 0.7 mg/dL (ref 0.44–1.00)
GFR, Estimated: >60 mL/min
Glucose, Bld: 88 mg/dL (ref 70–99)
Potassium: 2.5 mmol/L — CL (ref 3.5–5.1)
Sodium: 133 mmol/L — ABNORMAL LOW (ref 135–145)
Total Bilirubin: 0.5 mg/dL (ref 0.0–1.2)
Total Protein: 6.4 g/dL — ABNORMAL LOW (ref 6.5–8.1)

## 2024-11-28 LAB — MAGNESIUM: Magnesium: 2.3 mg/dL (ref 1.7–2.4)

## 2024-11-28 LAB — I-STAT CG4 LACTIC ACID, ED: Lactic Acid, Venous: 1 mmol/L (ref 0.5–1.9)

## 2024-11-28 MED ORDER — POTASSIUM CHLORIDE 10 MEQ/100ML IV SOLN: 10.0000 meq | INTRAVENOUS | Status: DC

## 2024-11-28 MED ORDER — OSELTAMIVIR PHOSPHATE 75 MG PO CAPS
75.0000 mg | ORAL_CAPSULE | Freq: Once | ORAL | Status: AC
  Administered 2024-11-28: 75 mg via ORAL
  Filled 2024-11-28: qty 1

## 2024-11-28 MED ORDER — POTASSIUM CHLORIDE CRYS ER 20 MEQ PO TBCR
40.0000 meq | EXTENDED_RELEASE_TABLET | Freq: Once | ORAL | Status: AC
  Administered 2024-11-28: 40 meq via ORAL
  Filled 2024-11-28: qty 2

## 2024-11-28 MED ORDER — OSELTAMIVIR PHOSPHATE 75 MG PO CAPS: 75.0000 mg | ORAL_CAPSULE | Freq: Two times a day (BID) | ORAL | 0 refills | Status: DC

## 2024-11-28 NOTE — ED Notes (Signed)
 Pt refusing IV , Pt is also wanting to leave the ED, reports she has found a friend who has an oxygen tank for her to use, Lawsing MD aware

## 2024-11-28 NOTE — Discharge Instructions (Signed)
 You tested positive for the flu and you were found to have acute hypoxic respiratory failure meaning your oxygen saturations were too low and you required to be on oxygen supplementation.  Your potassium was also found to be critically low and needs to be replenished.  It was recommended that you be admitted to the hospital for further observation however you declined this and have elected to leave AGAINST MEDICAL ADVICE.  You have passed he did make this decision and it was explained the risks of leaving AGAINST MEDICAL ADVICE to include significant morbidity and mortality which could include cardiac arrest from respiratory failure or an electrolyte abnormality.  Despite this recommendation for admission for observation you have still elected to leave AMA.

## 2024-11-28 NOTE — ED Notes (Signed)
 Pt is not willing to stay at the hospital , this RN notified Lawsing MD, who has now came back to pt bedside to discuss, pt is leaving ED AMA, Reports daughter is here to pick up. Pt encouraged to stay, not willing.

## 2024-11-28 NOTE — ED Provider Notes (Addendum)
 " [place] EMERGENCY DEPARTMENT AT [facility] Provider Note   CSN: [id number] Arrival date & time: 11/28/24  1219     Patient presents with: Shortness of Breath   Brittany Burns is a 68 y.o. female.    Shortness of Breath    68 year old female with medical history significant for HTN, HLD, GERD, CVA, tobacco abuse presenting to the emergency department with flulike symptoms.  The patient states that she has had a cough and shortness of breath for a few days.  She was found to be hypoxic with EMS to 86% and was placed on 2 L O2 with improvement in oxygen saturations.  And route the patient was febrile to 101.5 and administered 62 mg of Tylenol .  She denies any chest pain.  Prior to Admission medications  Medication Sig Start Date End Date Taking? Authorizing Provider  oseltamivir  (TAMIFLU ) 75 MG capsule Take 1 capsule (75 mg total) by mouth every 12 (twelve) hours. 11/28/24  Yes Jerrol Agent, MD  amLODipine  (NORVASC ) 10 MG tablet TAKE 1 TABLET(10 MG) BY MOUTH DAILY 05/03/24   Ngetich, Dinah C, NP  aspirin  EC 81 MG tablet Take 1 tablet (81 mg total) by mouth daily. Swallow whole. 10/04/24   Ngetich, Dinah C, NP  hydrocortisone  cream 1 % Apply to affected area once daily for the next 5 days 09/25/24   Veta Palma, PA-C  mupirocin  ointment (BACTROBAN ) 2 % Apply 1 Application topically 2 (two) times daily. 11/03/24   Johnie Flaming A, NP  omeprazole  (PRILOSEC) 20 MG capsule Take 1 capsule (20 mg total) by mouth 2 (two) times daily as needed. 12/28/23   Ngetich, Dinah C, NP  predniSONE  (STERAPRED UNI-PAK 21 TAB) 10 MG (21) TBPK tablet Take by mouth daily. Take 6 tabs by mouth daily  for 2 days, then 5 tabs for 2 days, then 4 tabs for 2 days, then 3 tabs for 2 days, 2 tabs for 2 days, then 1 tab by mouth daily for 2 days 11/19/24   Dasie Faden, MD    Allergies: Patient has no known allergies.    Review of Systems  Respiratory:  Positive for shortness of breath.    All other systems reviewed and are negative.   Updated Vital Signs BP 121/83 (BP Location: Right Arm)   Pulse (!) 111   Temp 99.4 F (37.4 C) (Oral)   Ht 5' 2 (1.575 m)   Wt 54.4 kg   SpO2 95%   BMI 21.95 kg/m   Physical Exam Vitals and nursing note reviewed.  Constitutional:      General: She is not in acute distress.    Appearance: She is well-developed.  HENT:     Head: Normocephalic and atraumatic.     Nose: Rhinorrhea present.  Eyes:     Conjunctiva/sclera: Conjunctivae normal.  Cardiovascular:     Rate and Rhythm: Normal rate and regular rhythm.     Heart sounds: No murmur heard. Pulmonary:     Effort: Pulmonary effort is normal. No respiratory distress.     Breath sounds: Normal breath sounds.     Comments: No respiratory distress, saturating well on 2 L O2 via nasal cannula Abdominal:     Palpations: Abdomen is soft.     Tenderness: There is no abdominal tenderness.  Musculoskeletal:        General: No swelling.     Cervical back: Neck supple.  Skin:    General: Skin is warm and dry.  Capillary Refill: Capillary refill takes less than 2 seconds.  Neurological:     Mental Status: She is alert.  Psychiatric:        Mood and Affect: Mood normal.     (all labs ordered are listed, but only abnormal results are displayed) Labs Reviewed  RESP PANEL BY RT-PCR (RSV, FLU A&B, COVID)  RVPGX2 - Abnormal; Notable for the following components:      Result Value   Influenza A by PCR POSITIVE (*)    All other components within normal limits  CBC WITH DIFFERENTIAL/PLATELET - Abnormal; Notable for the following components:   Lymphs Abs 0.4 (*)    All other components within normal limits  COMPREHENSIVE METABOLIC PANEL WITH GFR - Abnormal; Notable for the following components:   Sodium 133 (*)    Potassium 2.5 (*)    Chloride 96 (*)    Calcium  8.4 (*)    Total Protein 6.4 (*)    All other components within normal limits  MAGNESIUM  I-STAT CG4 LACTIC ACID, ED     EKG: None  Radiology: DG Chest 2 View Result Date: 11/28/2024 CLINICAL DATA:  Shortness of breath EXAM: CHEST - 2 VIEW COMPARISON:  Chest CT dated 12/25/2020. FINDINGS: Background of emphysema and chronic intra coarsening. No focal consolidation, pleural effusion, or pneumothorax. The cardiac silhouette is within normal limits. No acute osseous pathology. Osteopenia with degenerative changes of the spine. IMPRESSION: 1. No active cardiopulmonary disease. 2. Emphysema. Electronically Signed   By: Vanetta Chou M.D.   On: 11/28/2024 14:41     Procedures   Medications Ordered in the ED  potassium chloride  10 mEq in 100 mL IVPB (has no administration in time range)  oseltamivir  (TAMIFLU ) capsule 75 mg (75 mg Oral Given 11/28/24 1609)  potassium chloride  SA (KLOR-CON  M) CR tablet 40 mEq (40 mEq Oral Given 11/28/24 1630)    Clinical Course as of 11/28/24 1632  Tue Nov 28, 2024  1535 Influenza A By PCR(!): POSITIVE [JL]  1624 Potassium(!!): 2.5 [JL]    Clinical Course User Index [JL] Jerrol Agent, MD                                 Medical Decision Making Amount and/or Complexity of Data Reviewed Labs: ordered. Decision-making details documented in ED Course. Radiology: ordered.  Risk Prescription drug management. Decision regarding hospitalization.   68 year old female with medical history significant for HTN, HLD, GERD, CVA, tobacco abuse presenting to the emergency department with flulike symptoms.  The patient states that she has had a cough and shortness of breath for a few days.  She was found to be hypoxic with EMS to 86% and was placed on 2 L O2 with improvement in oxygen saturations.  And route the patient was febrile to 101.5 and administered 62 mg of Tylenol .  She denies any chest pain.  On arrival, the patient was afebrile, temperature 98.4 status post Tylenol , tachycardic heart rate 111, BP 121/83, saturating 94% on 2 L O2 via nasal cannula.  Patient  presenting with flulike illness, considered pneumonia, also considered COVID.   The patient was continued on oxygen supplementation due to her hypoxic respiratory failure.  Laboratory evaluation revealed CBC without a leukocytosis or anemia, lactic acid normal.  PCR testing was collected which resulted positive for influenza A.  The patient was administered Tamiflu  given her respiratory failure.  Chest x-ray: IMPRESSION:  1. No active cardiopulmonary  disease.  2. Emphysema.    Insetting of acute hypoxic respiratory failure due to influenza, medicine was consulted for admission.  The patient was updated regarding the plan of care. I was notified by nursing staff that the patient requested to leave AGAINST MEDICAL ADVICE.  She states that she has a friend who can provide her with oxygen tanks at home.  Her friend has 3 oxygen tanks.  I counseled her that this would not be a safe plan and strongly encouraged her to stay for admission.  I explained the risks of hypoxic respiratory failure to include worsening respiratory status and potentially cardiac arrest.  I also explained to her that her potassium is critically low and needs to be replenished.  Despite this, the patient still strongly requested to leave AGAINST MEDICAL ADVICE.  I counseled that the patient could not potentially follow-up in a timely fashion with her PCP and may run out of oxygen at home which could result in significant morbidity and mortality and I also explained to her the dangerousness of leaving with critically low potassium as well as respiratory failure.  We will discharge the patient AGAINST MEDICAL ADVICE would still prescribe Tamiflu  for treatment of influenza.     Final diagnoses:  Influenza A  Acute respiratory failure with hypoxia (HCC)  Hypokalemia    ED Discharge Orders          Ordered    oseltamivir  (TAMIFLU ) 75 MG capsule  Every 12 hours        11/28/24 1631               Jerrol Agent,  MD 11/28/24 1550    Jerrol Agent, MD 11/28/24 [phone]  "

## 2024-11-28 NOTE — ED Triage Notes (Signed)
 Pt BIB [facility], called out for cough but found to be hypoxic, 86% RA. 2L O2 up to 92%. Pt also endorses ear pain.  650 mg Tylenol  given enroute  HR 115, RR 20, BP 122/76, Temp 101.5 F

## 2024-12-05 ENCOUNTER — Ambulatory Visit (HOSPITAL_COMMUNITY)
Admission: EM | Admit: 2024-12-05 | Discharge: 2024-12-05 | Disposition: A | Discharge status: Home / Self Care | Attending: Internal Medicine | Admitting: Internal Medicine

## 2024-12-05 ENCOUNTER — Encounter (HOSPITAL_COMMUNITY): Payer: Self-pay

## 2024-12-05 DIAGNOSIS — L03211 Cellulitis of face: Secondary | ICD-10-CM

## 2024-12-05 DIAGNOSIS — L710 Perioral dermatitis: Secondary | ICD-10-CM | POA: Diagnosis not present

## 2024-12-05 DIAGNOSIS — R21 Rash and other nonspecific skin eruption: Secondary | ICD-10-CM | POA: Diagnosis not present

## 2024-12-05 MED ORDER — DESONIDE 0.05 % EX OINT: 1.0000 | TOPICAL_OINTMENT | Freq: Two times a day (BID) | CUTANEOUS | 0 refills | Status: AC | PRN

## 2024-12-05 MED ORDER — AMOXICILLIN-POT CLAVULANATE 875-125 MG PO TABS: 1.0000 | ORAL_TABLET | Freq: Two times a day (BID) | ORAL | 0 refills | Status: AC

## 2024-12-05 NOTE — Discharge Instructions (Addendum)
 Your facial rash is infected.  Please take Augmentin  antibiotic every 12 hours for the next 7 days to treat infection.  Use desonide steroid ointment every 12 hours as needed for itching associated with rash to the neck. Only use the steroid ointment for the next 7 to 10 days.  I encourage you to call your primary care provider to set up an appointment for recheck of this rash in 1 week to make sure it is getting better.  I have sent in a referral to a dermatologist (skin specialist) today.  Our referrals do not always go through so please call Dr. Lyndee office (dermatologist) in 4-5 business days should you fail to hear from their office to set up an appointment.  I expect you to be able to get in with a dermatologist in the next 3 to 6 months.  Follow-up with your primary care provider in the meantime for further management of this rash.  Go to the emergency room if you develop tongue swelling, neck swelling, fever, chills, rapidly spreading/worsening rash.
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
## 2024-12-05 NOTE — ED Notes (Signed)
 Patient refused blood draw. She states that she was too afraid of the needle and didn't even want to look at it.

## 2024-12-05 NOTE — ED Triage Notes (Signed)
 Patient here today with c/o a rash around mouth and neck X 3 weeks. Patient called her Dermatologist and was recommended CeraVe night cream with no relief. Patient states that the rash is sore. Patient has also used Vaseline with no relief. Patient has also taken Prednisone  with no relief.

## 2025-04-04 ENCOUNTER — Ambulatory Visit: Admitting: Family
